# Patient Record
Sex: Male | Born: 1965 | Race: White | Hispanic: No | Marital: Married | Smoking: Former smoker
Health system: Southern US, Community
[De-identification: ages and names within clinical notes are randomized; demographics above are authoritative.]

## PROBLEM LIST (undated history)

## (undated) DIAGNOSIS — M1990 Unspecified osteoarthritis, unspecified site: Secondary | ICD-10-CM

## (undated) DIAGNOSIS — K219 Gastro-esophageal reflux disease without esophagitis: Secondary | ICD-10-CM

## (undated) DIAGNOSIS — G4730 Sleep apnea, unspecified: Secondary | ICD-10-CM

## (undated) DIAGNOSIS — J189 Pneumonia, unspecified organism: Secondary | ICD-10-CM

## (undated) DIAGNOSIS — J45909 Unspecified asthma, uncomplicated: Secondary | ICD-10-CM

## (undated) DIAGNOSIS — I10 Essential (primary) hypertension: Secondary | ICD-10-CM

## (undated) DIAGNOSIS — I499 Cardiac arrhythmia, unspecified: Secondary | ICD-10-CM

## (undated) HISTORY — PX: HERNIA REPAIR: SHX51

## (undated) HISTORY — PX: CARPAL TUNNEL RELEASE: SHX101

---

## 1998-06-28 ENCOUNTER — Ambulatory Visit (HOSPITAL_COMMUNITY): Admission: RE | Admit: 1998-06-28 | Discharge: 1998-06-28 | Payer: Self-pay | Admitting: Specialist

## 1998-06-29 ENCOUNTER — Encounter: Admission: RE | Admit: 1998-06-29 | Discharge: 1998-09-27 | Payer: Self-pay | Admitting: Specialist

## 1999-01-31 ENCOUNTER — Encounter: Payer: Self-pay | Admitting: Specialist

## 1999-01-31 ENCOUNTER — Ambulatory Visit (HOSPITAL_COMMUNITY): Admission: RE | Admit: 1999-01-31 | Discharge: 1999-01-31 | Payer: Self-pay | Admitting: Specialist

## 2004-11-06 ENCOUNTER — Encounter: Admission: RE | Admit: 2004-11-06 | Discharge: 2004-11-06 | Payer: Self-pay | Admitting: Family Medicine

## 2005-03-04 ENCOUNTER — Emergency Department (HOSPITAL_COMMUNITY): Admission: EM | Admit: 2005-03-04 | Discharge: 2005-03-05 | Payer: Self-pay | Admitting: Emergency Medicine

## 2006-08-06 ENCOUNTER — Emergency Department (HOSPITAL_COMMUNITY): Admission: EM | Admit: 2006-08-06 | Discharge: 2006-08-06 | Payer: Self-pay | Admitting: Emergency Medicine

## 2006-08-06 ENCOUNTER — Ambulatory Visit: Payer: Self-pay | Admitting: Cardiology

## 2006-08-24 ENCOUNTER — Ambulatory Visit: Payer: Self-pay | Admitting: Cardiovascular Disease

## 2007-02-28 ENCOUNTER — Emergency Department (HOSPITAL_COMMUNITY): Admission: EM | Admit: 2007-02-28 | Discharge: 2007-02-28 | Payer: Self-pay | Admitting: Emergency Medicine

## 2009-02-28 ENCOUNTER — Emergency Department (HOSPITAL_BASED_OUTPATIENT_CLINIC_OR_DEPARTMENT_OTHER): Admission: EM | Admit: 2009-02-28 | Discharge: 2009-02-28 | Payer: Self-pay | Admitting: Emergency Medicine

## 2009-02-28 ENCOUNTER — Ambulatory Visit: Payer: Self-pay | Admitting: Diagnostic Radiology

## 2010-06-23 ENCOUNTER — Emergency Department (HOSPITAL_COMMUNITY): Admission: EM | Admit: 2010-06-23 | Discharge: 2010-06-24 | Payer: Self-pay | Admitting: Emergency Medicine

## 2011-01-17 LAB — BASIC METABOLIC PANEL
CO2: 25 mEq/L (ref 19–32)
Calcium: 8.7 mg/dL (ref 8.4–10.5)
Creatinine, Ser: 0.96 mg/dL (ref 0.4–1.5)
GFR calc non Af Amer: >60 mL/min (ref >60)
Glucose, Bld: 96 mg/dL (ref 70–99)
Sodium: 134 mEq/L — ABNORMAL LOW (ref 135–145)

## 2011-01-17 LAB — CBC
MCH: 31.8 pg (ref 26.0–34.0)
MCV: 88.3 fL (ref 78.0–100.0)
Platelets: 233 10*3/uL (ref 150–400)
RBC: 4.69 MIL/uL (ref 4.22–5.81)
RDW: 12.6 % (ref 11.5–15.5)
WBC: 13.3 10*3/uL — ABNORMAL HIGH (ref 4.0–10.5)

## 2011-01-17 LAB — DIFFERENTIAL
Basophils Absolute: 0 10*3/uL (ref 0.0–0.1)
Basophils Relative: 0 % (ref 0–1)
Eosinophils Absolute: 0.3 10*3/uL (ref 0.0–0.7)
Eosinophils Relative: 2 % (ref 0–5)
Neutrophils Relative %: 74 % (ref 43–77)

## 2011-01-17 LAB — HEPATIC FUNCTION PANEL
Albumin: 3.7 g/dL (ref 3.5–5.2)
Total Protein: 7 g/dL (ref 6.0–8.3)

## 2011-01-17 LAB — URINALYSIS, ROUTINE W REFLEX MICROSCOPIC
Bilirubin Urine: NEGATIVE
Nitrite: NEGATIVE
Specific Gravity, Urine: 1.016 (ref 1.005–1.030)
Urobilinogen, UA: 0.2 mg/dL (ref 0.0–1.0)
pH: 8.5 — ABNORMAL HIGH (ref 5.0–8.0)

## 2011-01-17 LAB — LIPASE, BLOOD: Lipase: 21 U/L (ref 11–59)

## 2011-03-21 NOTE — H&P (Signed)
NAME:  Robert Bonilla, Robert Bonilla                ACCOUNT NO.:  [id number]   MEDICAL RECORD NO.:  [id number]          PATIENT TYPE:  EMS   LOCATION:  [facility]                         FACILITY:  [facility]   PHYSICIAN:  Gerrit Friends. Dietrich Pates, MD, FACCDATE OF BIRTH:  1966-10-11   DATE OF ADMISSION:  08/06/2006  DATE OF DISCHARGE:  08/06/2006                                HISTORY & PHYSICAL   EMERGENCY DEPARTMENT NOTE:   REFERRING PHYSICIAN:  Carleene Cooper, M.D.   PRIMARY CARE PHYSICIAN:  Nurse practitioner with Deboraha Sprang.   PRIMARY CARE PHYSICIAN:  Noralyn Pick. Eden Emms, MD, [facility]   HISTORY OF PRESENT ILLNESS:  A 45 year old gentleman with a recent history  of hypertension presenting to the emergency department with 4 hours of  palpitations.  Mr. Yaffe has had intermittent chest discomfort and underwent  a cardiac catheterization approximately 10 years ago with normal results.  He was recently found to have cardiomegaly on chest x-ray.  An  echocardiogram was reportedly unremarkable except for slight cardiac  enlargement.  He has never had any thyroid problems.  He does not take any  over-the-counter stimulants.  He does not use significant amounts of  caffeine.  He does not use significant amounts of alcohol nor illicit drugs.  He has had episodic palpitations in the past lasting less than 1 minute.  This morning, he noted the sudden onset of similar palpitations, but these  have been prolonged.  On the way to the emergency department, he noted some  burning in his right chest.  He has had no dyspnea.  He has had some  flushing.  Since diltiazem was administered with slowing of the rate, he has  felt fine.   PAST MEDICAL HISTORY:  1. Hypertension diagnosed 6 months ago and treated with a single drug.  2. He was seen in the emergency department 1 year ago for chest discomfort      and palpitations..  He was treated with potassium at that time.  3. He has mild asthma that has not required much in the way of  treatment.   PRIOR SURGERIES:  1. Bilateral carpal tunnel releases.  2. Repair of an umbilical hernia.  3. There is a history of GERD controlled with a PPI.   ALLERGIES:  The patient has no known allergies.   CURRENT MEDICATIONS:  1. Benicar.  2. Prilosec.  3. Advair discus.   SOCIAL HISTORY:  Remote use of tobacco products with total consumption of  approximately 10 pack-years.  No excessive use of alcohol.   FAMILY HISTORY:  No prominent coronary disease.   REVIEW OF SYSTEMS:  Noncontributory, with pertinent negatives as noted  above.   PHYSICAL EXAMINATION:  GENERAL:  A pleasant gentleman in no acute distress.  VITAL SIGNS:  The temperature is 97.9, heart rate 85 and irregular, blood  pressure 105/70, O2 saturation 98% on 2 liters of nasal O2.  HEENT:  Pupils equal, round, react to light; EOMs full; normal oral mucosa.  NECK:  No jugular venous distension; normal carotid upstrokes without  bruits.  ENDOCRINE:  No thyromegaly.  HEMATOPOIETIC:  No adenopathy.  SKIN:  No significant lesions.  LUNGS:  Clear.  CARDIAC:  Irregular rhythm; normal first and second heart sounds.  ABDOMEN:  Soft and nontender; normal bowel sounds; no masses; no  organomegaly.  EXTREMITIES:  Normal distal pulses; no edema.  NEUROMUSCULAR:  Symmetric strength and tone.  MUSCULOSKELETAL:  No joint deformities; surgical scars over the palms  bilaterally.   ELECTROCARDIOGRAM:  atrial fibrillation with rapid ventricular response;  early R-wave progression - possible lead placement error; nonspecific ST-T  wave abnormality.   Initial point of the care markers are negative.  Metabolic profile and CBC  are normal.   CHEST X-RAY:  Unremarkable.   IMPRESSION:  Mr. Ferencz presents with a new onset of atrial fibrillation  after likely brief episodes in the past.  Hypertension could be  contributing, or his arrhythmia may be idiopathic.  A TSH level will be  obtained to rule out hyperthyroidism.  Since  his arrhythmia has been present  for less than 24 hours, acute cardioversion can be attempted.  Flecainide  300 mg will be given.  He will be monitored in the emergency department for  a few hours.  If he converts, he will be sent home after initiating therapy  with a beta blocker, which will also probably control his mild hypertension.  Due to his mild right-sided chest burning, an additional set of point of  care markers will be obtained prior to discharge.      Gerrit Friends. Dietrich Pates, MD, [facility]  Electronically Signed     RMR/MEDQ  D:  08/06/2006  T:  08/07/2006  Job:  [id number]

## 2011-03-21 NOTE — Assessment & Plan Note (Signed)
[facility]                              CARDIOLOGY OFFICE NOTE   Robert, Bonilla                       MRN:          [id number]  DATE:08/24/2006                            DOB:          1966-09-09    Robert Bonilla is seen today in followup.  I believe I saw him back in 1999.  He  was more recently seen by Dr. Dietrich Pates for PAF.   The patient has a history of hypertension.   He is on Advair for some asthma.  Dr. Dietrich Pates saw him for some PAF.  He  converted in the emergency room.  He was sent home on Toprol rather than  Benicar.  He did not feel well on this at all.  He felt fatigued and had a  headache.  He stopped it on his own and switched back to his Benicar.  He  has continued to have occasional palpitations.  They are not very often and  they do not cause too much distress.   The patient has a history of normal cath in 1999.  There is no other history  of structural heart disease.   He ruled out for myocardial infarction when he was seen in the hospital.  His thyroid function was normal.   He has not had any previous embolic events.  He is not having significant  chest pain, PND or orthopnea.   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   EXAM:  VITAL SIGNS:  Blood pressure is reasonable at 130/72.  Pulse is 73  and regular.  LUNGS:  Clear.  NECK:  Carotids are normal.  There is no thyromegaly.  There is no  adenopathy.  SKIN:  Warm and dry.  HEART:  There is S1 and S2.  Normal heart sounds.  ABDOMEN:  Benign.  LOWER EXTREMITIES:  Pulses no edema.   MEDICATIONS:  1. Prilosec 20 a day.  2. Benicar 40/25.  3. Advair 100/50.  4. Ventolin.  5. Claritin.   IMPRESSION:  Stable palpitations, possible recurrent paroxysmal atrial  fibrillation.  The patient given prescription for 10 of Inderal to take on a  p.r.n. basis since he did not tolerate the  Toprol.  Will continue his  Benicar for blood pressure control.  He knows to call us if he has  significant recurrent episodes for an event monitor.  I will see him back in  3 months' to reassess his blood pressure and rhythm.   I will have to look through his chart to see if he has had a 2-D  echocardiogram recently to assess his LV function.   He needs to lose weight and we talked about his diet a little bit.   I suspect that not all of his palpitations represent paroxysmal atrial  fibrillation.  I do not think there is any indication for Coumadin at this  time.    ______________________________  Noralyn Pick Eden Emms, MD, [facility]    PCN/MedQ  DD: 08/24/2006  DT:  08/25/2006  Job #: [id number]

## 2011-12-28 ENCOUNTER — Encounter (HOSPITAL_COMMUNITY): Payer: Self-pay | Admitting: *Deleted

## 2011-12-28 ENCOUNTER — Emergency Department (HOSPITAL_COMMUNITY): Payer: BC Managed Care – PPO

## 2011-12-28 ENCOUNTER — Emergency Department (HOSPITAL_COMMUNITY)
Admission: EM | Admit: 2011-12-28 | Discharge: 2011-12-28 | Disposition: A | Discharge status: Home Health | Payer: BC Managed Care – PPO | Attending: Emergency Medicine | Admitting: Emergency Medicine

## 2011-12-28 DIAGNOSIS — M47812 Spondylosis without myelopathy or radiculopathy, cervical region: Secondary | ICD-10-CM | POA: Insufficient documentation

## 2011-12-28 DIAGNOSIS — I10 Essential (primary) hypertension: Secondary | ICD-10-CM | POA: Insufficient documentation

## 2011-12-28 DIAGNOSIS — J3489 Other specified disorders of nose and nasal sinuses: Secondary | ICD-10-CM | POA: Insufficient documentation

## 2011-12-28 DIAGNOSIS — R2990 Unspecified symptoms and signs involving the nervous system: Secondary | ICD-10-CM

## 2011-12-28 DIAGNOSIS — R209 Unspecified disturbances of skin sensation: Secondary | ICD-10-CM | POA: Insufficient documentation

## 2011-12-28 DIAGNOSIS — K219 Gastro-esophageal reflux disease without esophagitis: Secondary | ICD-10-CM | POA: Insufficient documentation

## 2011-12-28 DIAGNOSIS — R29898 Other symptoms and signs involving the musculoskeletal system: Secondary | ICD-10-CM | POA: Insufficient documentation

## 2011-12-28 HISTORY — DX: Gastro-esophageal reflux disease without esophagitis: K21.9

## 2011-12-28 HISTORY — DX: Essential (primary) hypertension: I10

## 2011-12-28 LAB — DIFFERENTIAL
Basophils Absolute: 0 10*3/uL (ref 0.0–0.1)
Basophils Relative: 0 % (ref 0–1)
Eosinophils Absolute: 0.2 10*3/uL (ref 0.0–0.7)
Monocytes Relative: 10 % (ref 3–12)
Neutrophils Relative %: 54 % (ref 43–77)

## 2011-12-28 LAB — CBC
Hemoglobin: 15.3 g/dL (ref 13.0–17.0)
MCH: 30.8 pg (ref 26.0–34.0)
MCHC: 36 g/dL (ref 30.0–36.0)
RDW: 12.8 % (ref 11.5–15.5)

## 2011-12-28 LAB — BASIC METABOLIC PANEL
BUN: 11 mg/dL (ref 6–23)
Creatinine, Ser: 0.91 mg/dL (ref 0.50–1.35)
GFR calc Af Amer: >90 mL/min (ref >90)
GFR calc non Af Amer: >90 mL/min (ref >90)
Potassium: 3.6 mEq/L (ref 3.5–5.1)

## 2011-12-28 LAB — GLUCOSE, CAPILLARY: Glucose-Capillary: 84 mg/dL (ref 70–99)

## 2011-12-28 LAB — POCT I-STAT TROPONIN I

## 2011-12-28 MED ORDER — SODIUM CHLORIDE 0.9 % IV BOLUS (SEPSIS)
500.0000 mL | Freq: Once | INTRAVENOUS | Status: AC
  Administered 2011-12-28: 500 mL via INTRAVENOUS

## 2011-12-28 NOTE — ED Notes (Signed)
Pt on stretcher, nad noted.

## 2011-12-28 NOTE — Discharge Instructions (Signed)
Mr Kimmel for CT of your head today shows no acute processes. The heaviness in your  left arm may be coming from cervical disc degeneration. Followup with your PCP next week and he also followup with an orthopedic doctor to have your cervical spine evaluated. Return to the ER for severe weakness, numbness or you are unable to hold items in your L hand.

## 2011-12-28 NOTE — ED Provider Notes (Signed)
History     CSN: [id number]  Arrival date & time 12/28/11  1505   First MD Initiated Contact with Patient 12/28/11 1526      Chief Complaint  Patient presents with  . Stroke Symptoms    (Consider location/radiation/quality/duration/timing/severity/associated sxs/prior treatment) Patient is a 46 y.o. male presenting with extremity weakness. The history is provided by the patient. No language interpreter was used.  Extremity Weakness This is a new problem. The current episode started today. Episode frequency: once. The problem has been resolved. Associated symptoms include congestion. Pertinent negatives include no chest pain, fever, headaches, neck pain, numbness or weakness.   Patient was throwing ball 2 hours ago with son when his LUE felt "heavy" and he had to stop throwing the ball.  He then had numbness and tingling in his L jaw for several minutes that has resolved presently.  States that he still has heaviness in his left upper extremity but that has improved as well. Prior elbow surgery on the left and prior carpal tunnel surgery on the left as well. He goes to [facility] his PCP and [facility] in the past.  Denies Weakness, speech problems or balance problems.  Will do a CT of the head EKG troponin and labs and then reevaluate. States he has had some UR symptoms this week. Pt is right handed. Neuro in tact presently. Reports remote cervical problems 15 years ago.  Father has had 2 tias and Grandfather has had a stroke.   Past Medical History  Diagnosis Date  . Hypertension   . Acid reflux     History reviewed. No pertinent past surgical history.  History reviewed. No pertinent family history.  History  Substance Use Topics  . Smoking status: Not on file  . Smokeless tobacco: Not on file  . Alcohol Use: Yes     occ      Review of Systems  Constitutional: Negative for fever.  HENT: Positive for congestion and rhinorrhea. Negative for facial  swelling, neck pain and neck stiffness.   Respiratory: Negative for chest tightness and shortness of breath.   Cardiovascular: Negative for chest pain, palpitations and leg swelling.  Gastrointestinal: Negative.   Musculoskeletal: Positive for extremity weakness. Negative for back pain and gait problem.  Skin: Negative.   Neurological: Negative for dizziness, tremors, seizures, syncope, facial asymmetry, speech difficulty, weakness, light-headedness, numbness and headaches.       Heaviness to LUE numbness and tingling  to L jaw  Psychiatric/Behavioral: Negative.  Negative for suicidal ideas, confusion and sleep disturbance. The patient is not nervous/anxious.   All other systems reviewed and are negative.    Allergies  Review of patient's allergies indicates no known allergies.  Home Medications   Current Outpatient Rx  Name Route Sig Dispense Refill  . LISINOPRIL 10 MG PO TABS Oral Take 10 mg by mouth daily.    . OMEGA-3-ACID ETHYL ESTERS 1 G PO CAPS Oral Take 2 g by mouth daily.    [name] Kitchen OMEPRAZOLE 20 MG PO CPDR Oral Take 20 mg by mouth daily.      BP 133/75  Pulse 79  Temp(Src) 97.6 F (36.4 C) (Oral)  Resp 18  SpO2 97%  Physical Exam  Nursing note and vitals reviewed. Constitutional: He is oriented to person, place, and time. He appears well-developed and well-nourished.  HENT:  Head: Normocephalic and atraumatic.  Eyes: Pupils are equal, round, and reactive to light.  Neck: Neck supple.  Cardiovascular: Normal rate, regular  rhythm, normal heart sounds and intact distal pulses.  Exam reveals no gallop and no friction rub.   No murmur heard. Pulmonary/Chest: Effort normal and breath sounds normal. No respiratory distress.  Abdominal: Soft. He exhibits no distension.  Musculoskeletal: Normal range of motion.  Neurological: He is alert and oriented to person, place, and time. He has normal strength. He displays normal reflexes. No cranial nerve deficit or sensory deficit.  Coordination normal. GCS eye subscore is 4. GCS verbal subscore is 5. GCS motor subscore is 6.  Skin: Skin is warm and dry.  Psychiatric: He has a normal mood and affect.    ED Course  Procedures (including critical care time)   Labs Reviewed  GLUCOSE, CAPILLARY  CBC  DIFFERENTIAL  BASIC METABOLIC PANEL   No results found.   No diagnosis found.    MDM  1530- 1540  patient not in the room.  46 year old hypertension hyperlipidemia coming in today after a feeling of heaviness in his left upper extremity and numbness and tingling in his left jaw. The tingling lasted several minutes and the heaviness in his left arm is better now but still feels somewhat heavy 2 to out of 10 where it was 8/10. CT of the head is normal. EKG and troponin are negative. Remote cervical problems in the past 15 years ago. Doubt this was a neurological event. Possible cervical degeneration as the cause. He will followup with his PCP next week. He'll also followup with his orthopedic as needed. Return for severe left upper extremity weakness or dropping things.   Date: 12/28/2011  Rate: 67  Rhythm: normal sinus rhythm  QRS Axis: normal  Intervals: normal  ST/T Wave abnormalities: normal  Conduction Disutrbances:first-degree A-V block   Narrative Interpretation: New 1st degree av block was in afib in 2007  Old EKG Reviewed: changes noted      Jethro Bastos, NP 12/29/11 0410

## 2011-12-28 NOTE — ED Notes (Signed)
Gave old and new ECG to Dr. Freida Busman after I performed. 5:13pm JG.

## 2011-12-28 NOTE — ED Provider Notes (Signed)
Medical screening examination/treatment/procedure(s) were conducted as a shared visit with non-physician practitioner(s) and myself.  I personally evaluated the patient during the encounter  Patient seen and examined. Patient states that his left arm heaviness which has been persistent all day starts in his neck and certain movements of his neck make this worse. History of cervical disc disease in the past as well 2. He also noted some left lower jaw facial tingling without numbness. Patients upper extremity heaviness is likely due to cervical disc disease. Will send cardiac enzymes head CT suspect this will be negative he'll followup with Dr.  Toy Baker, MD 12/28/11 [phone]

## 2011-12-28 NOTE — ED Notes (Signed)
Reports playing ball with his son at approx 1330, started having numbness sensation to left side of face and left arm felt heavy. Pt was assessed by dr Brooke Dare at triage, has no deficits, speech clear, no facial droop, equal arm strength. Pt reports that symptoms have not resolved at this time.

## 2011-12-28 NOTE — ED Notes (Signed)
Lab at bedside

## 2011-12-28 NOTE — ED Notes (Signed)
Pt has not arrived in room

## 2011-12-29 NOTE — ED Provider Notes (Signed)
Medical screening examination/treatment/procedure(s) were conducted as a shared visit with non-physician practitioner(s) and myself.  I personally evaluated the patient during the encounter  Toy Baker, MD 12/29/11 2228

## 2014-06-20 ENCOUNTER — Other Ambulatory Visit (HOSPITAL_COMMUNITY): Payer: Self-pay | Admitting: Specialist

## 2014-06-20 ENCOUNTER — Ambulatory Visit (HOSPITAL_COMMUNITY)
Admission: RE | Admit: 2014-06-20 | Discharge: 2014-06-20 | Disposition: A | Discharge status: Home / Self Care | Payer: Worker's Compensation | Source: Ambulatory Visit | Attending: Specialist | Admitting: Specialist

## 2014-06-20 DIAGNOSIS — Z9189 Other specified personal risk factors, not elsewhere classified: Secondary | ICD-10-CM | POA: Diagnosis present

## 2014-06-20 DIAGNOSIS — M25512 Pain in left shoulder: Secondary | ICD-10-CM

## 2014-06-20 DIAGNOSIS — Z0389 Encounter for observation for other suspected diseases and conditions ruled out: Secondary | ICD-10-CM | POA: Diagnosis not present

## 2014-06-20 DIAGNOSIS — Z1389 Encounter for screening for other disorder: Secondary | ICD-10-CM | POA: Diagnosis present

## 2014-06-20 DIAGNOSIS — M25519 Pain in unspecified shoulder: Secondary | ICD-10-CM | POA: Diagnosis present

## 2014-07-31 ENCOUNTER — Encounter (HOSPITAL_COMMUNITY): Payer: Self-pay

## 2014-08-02 NOTE — Pre-Procedure Instructions (Signed)
Kathlene NovemberLowell D Bettcher  08/02/2014   Your procedure is scheduled on:  Thurs, Oct 8 @ 12:30 PM  [facility]  at 10:30 AM.  Call this number if you have problems the morning of surgery: [phone]   Remember:   Do not eat food or drink liquids after midnight.   Take these medicines the morning of surgery with A SIP OF WATER: Omeprazole(Prilosec)               Stop taking your Ibuprofen. No Goody's,BC's,Aleve,Aspirin,Fish Oil,or any Herbal Medications   Do not wear jewelry  Do not wear lotions, powders, or colognes.   Men may shave face and neck.  Do not bring valuables to the hospital.  [facility] is not responsible                  for any belongings or valuables.               Contacts, dentures or bridgework may not be worn into surgery.  Leave suitcase in the car. After surgery it may be brought to your room.  For patients admitted to the hospital, discharge time is determined by your                treatment team.               Patients discharged the day of surgery will not be allowed to drive  home.    Special Instructions:  [place] - Preparing for Surgery  Before surgery, you can play an important role.  Because skin is not sterile, your skin needs to be as free of germs as possible.  You can reduce the number of germs on you skin by washing with CHG (chlorahexidine gluconate) soap before surgery.  CHG is an antiseptic cleaner which kills germs and bonds with the skin to continue killing germs even after washing.  Please DO NOT use if you have an allergy to CHG or antibacterial soaps.  If your skin becomes reddened/irritated stop using the CHG and inform your nurse when you arrive at Short Stay.  Do not shave (including legs and underarms) for at least 48 hours prior to the first CHG shower.  You may shave your face.  Please follow these instructions carefully:   1.  Shower with CHG Soap the night before surgery and the                                 morning of Surgery.  2.  If you choose to wash your hair, wash your hair first as usual with your       normal shampoo.  3.  After you shampoo, rinse your hair and body thoroughly to remove the                      Shampoo.  4.  Use CHG as you would any other liquid soap.  You can apply chg directly       to the skin and wash gently with scrungie or a clean washcloth.  5.  Apply the CHG Soap to your body ONLY FROM THE NECK DOWN.        Do not use on open wounds or open sores.  Avoid contact with your eyes,       ears, mouth and genitals (private parts).  Wash genitals (private parts)  with your normal soap.  6.  Wash thoroughly, paying special attention to the area where your surgery        will be performed.  7.  Thoroughly rinse your body with warm water from the neck down.  8.  DO NOT shower/wash with your normal soap after using and rinsing off       the CHG Soap.  9.  Pat yourself dry with a clean towel.            10.  Wear clean pajamas.            11.  Place clean sheets on your bed the night of your first shower and do not        sleep with pets.  Day of Surgery  Do not apply any lotions/deoderants the morning of surgery.  Please wear clean clothes to the hospital/surgery center.     Please read over the following fact sheets that you were given: Pain Booklet, Coughing and Deep Breathing and Surgical Site Infection Prevention

## 2014-08-03 ENCOUNTER — Encounter (HOSPITAL_COMMUNITY)
Admission: RE | Admit: 2014-08-03 | Discharge: 2014-08-03 | Disposition: A | Discharge status: Home / Self Care | Payer: Worker's Compensation | Source: Ambulatory Visit | Attending: Orthopedic Surgery | Admitting: Orthopedic Surgery

## 2014-08-03 ENCOUNTER — Encounter (HOSPITAL_COMMUNITY): Payer: Self-pay

## 2014-08-03 ENCOUNTER — Ambulatory Visit (HOSPITAL_COMMUNITY)
Admission: RE | Admit: 2014-08-03 | Discharge: 2014-08-03 | Disposition: A | Discharge status: Home / Self Care | Payer: Worker's Compensation | Source: Ambulatory Visit | Attending: Anesthesiology | Admitting: Anesthesiology

## 2014-08-03 DIAGNOSIS — I10 Essential (primary) hypertension: Secondary | ICD-10-CM | POA: Diagnosis not present

## 2014-08-03 DIAGNOSIS — M19012 Primary osteoarthritis, left shoulder: Secondary | ICD-10-CM | POA: Diagnosis not present

## 2014-08-03 DIAGNOSIS — Z01810 Encounter for preprocedural cardiovascular examination: Secondary | ICD-10-CM | POA: Diagnosis not present

## 2014-08-03 DIAGNOSIS — S43432A Superior glenoid labrum lesion of left shoulder, initial encounter: Secondary | ICD-10-CM | POA: Diagnosis not present

## 2014-08-03 DIAGNOSIS — X58XXXA Exposure to other specified factors, initial encounter: Secondary | ICD-10-CM | POA: Diagnosis not present

## 2014-08-03 DIAGNOSIS — Z01812 Encounter for preprocedural laboratory examination: Secondary | ICD-10-CM | POA: Diagnosis present

## 2014-08-03 DIAGNOSIS — M7542 Impingement syndrome of left shoulder: Secondary | ICD-10-CM | POA: Insufficient documentation

## 2014-08-03 HISTORY — DX: Cardiac arrhythmia, unspecified: I49.9

## 2014-08-03 HISTORY — DX: Sleep apnea, unspecified: G47.30

## 2014-08-03 HISTORY — DX: Pneumonia, unspecified organism: J18.9

## 2014-08-03 HISTORY — DX: Unspecified osteoarthritis, unspecified site: M19.90

## 2014-08-03 HISTORY — DX: Unspecified asthma, uncomplicated: J45.909

## 2014-08-03 LAB — CBC WITH DIFFERENTIAL/PLATELET
BASOS PCT: 1 % (ref 0–1)
Basophils Absolute: 0 10*3/uL (ref 0.0–0.1)
EOS ABS: 0.2 10*3/uL (ref 0.0–0.7)
Eosinophils Relative: 3 % (ref 0–5)
HEMATOCRIT: 42 % (ref 39.0–52.0)
Hemoglobin: 13.8 g/dL (ref 13.0–17.0)
LYMPHS ABS: 1.5 10*3/uL (ref 0.7–4.0)
Lymphocytes Relative: 24 % (ref 12–46)
MCH: 27.9 pg (ref 26.0–34.0)
MCHC: 32.9 g/dL (ref 30.0–36.0)
MCV: 84.8 fL (ref 78.0–100.0)
Monocytes Absolute: 0.5 10*3/uL (ref 0.1–1.0)
Monocytes Relative: 7 % (ref 3–12)
NEUTROS ABS: 4 10*3/uL (ref 1.7–7.7)
NEUTROS PCT: 65 % (ref 43–77)
Platelets: 254 10*3/uL (ref 150–400)
RBC: 4.95 MIL/uL (ref 4.22–5.81)
RDW: 14 % (ref 11.5–15.5)
WBC: 6.2 10*3/uL (ref 4.0–10.5)

## 2014-08-03 LAB — COMPREHENSIVE METABOLIC PANEL
ALK PHOS: 62 U/L (ref 39–117)
ALT: 41 U/L (ref 0–53)
AST: 22 U/L (ref 0–37)
Albumin: 3.9 g/dL (ref 3.5–5.2)
Anion gap: 14 (ref 5–15)
BILIRUBIN TOTAL: 0.7 mg/dL (ref 0.3–1.2)
BUN: 12 mg/dL (ref 6–23)
CHLORIDE: 101 meq/L (ref 96–112)
CO2: 23 mEq/L (ref 19–32)
Calcium: 9.3 mg/dL (ref 8.4–10.5)
Creatinine, Ser: 0.89 mg/dL (ref 0.50–1.35)
GLUCOSE: 107 mg/dL — AB (ref 70–99)
POTASSIUM: 4.2 meq/L (ref 3.7–5.3)
Sodium: 138 mEq/L (ref 137–147)
Total Protein: 7.9 g/dL (ref 6.0–8.3)

## 2014-08-03 LAB — APTT: aPTT: 26 seconds (ref 24–37)

## 2014-08-03 LAB — PROTIME-INR
INR: 0.98 (ref 0.00–1.49)
Prothrombin Time: 13 seconds (ref 11.6–15.2)

## 2014-08-04 NOTE — Progress Notes (Addendum)
Anesthesia Chart Review:  Pt is 48 year old male scheduled for L shoulder arthroscopy with labral debridement vs bicep tenodesis, subacromial decompression, distal clavicle resection on 08/10/14 with Dr. Rennis ChrisSupple.   PMH: paroxysmal atrial fibrillation, HTN, OSA, asthma, GERD  Medications include: lisinopril, prilosec  Preoperative labs reviewed.    Chest x-ray reviewed. No active cardiopulmonary process.  EKG: Sinus rhythm with Premature supraventricular complexes Nonspecific T wave abnormality.   Last seen by cardiology by Dr. Eden EmmsNishan on 08/24/2006. Stable at that time. By notes, pt had hx normal cath in 1999.   Discussed with Dr. Randa EvensEdwards who requests medical clearance.   Spoke with Toniann FailWendy in Dr. Dub MikesSupple's office who will arrange for medical clearance with pt's PCP, Lovenia KimMark Hepler, PA-C at [facility] [place].   Rica Mastngela Kabbe, FNP-BC [facility]/Anesthesiology Phone: [phone] 08/04/2014 12:37 PM  Addendum: Patient was seen for medical clearance by Johna SheriffMark Helper, PA-C on 08/07/14.  He cleared patient for upcoming surgery.   Velna Ochsllison Taylen Wendland, PA-C [facility]iology Phone [phone] 08/08/2014 9:24 AM

## 2014-08-07 NOTE — Progress Notes (Signed)
Spoke with Darla at [facility] office.  Pt to be seen today for medical clearance.  Office to send over info after seeing the Dr today.( Medical clearance, Last office visit, and any cardiac studies, and sleep study)

## 2014-08-09 MED ORDER — CEFAZOLIN SODIUM-DEXTROSE 2-3 GM-% IV SOLR
2.0000 g | INTRAVENOUS | Status: AC
  Administered 2014-08-10: 2 g via INTRAVENOUS

## 2014-08-10 ENCOUNTER — Encounter (HOSPITAL_COMMUNITY): Payer: Self-pay | Admitting: *Deleted

## 2014-08-10 ENCOUNTER — Ambulatory Visit (HOSPITAL_COMMUNITY): Payer: Worker's Compensation | Admitting: Certified Registered Nurse Anesthetist

## 2014-08-10 ENCOUNTER — Ambulatory Visit (HOSPITAL_COMMUNITY)
Admission: RE | Admit: 2014-08-10 | Discharge: 2014-08-10 | Disposition: A | Discharge status: Home / Self Care | Payer: Worker's Compensation | Source: Ambulatory Visit | Attending: Orthopedic Surgery | Admitting: Orthopedic Surgery

## 2014-08-10 ENCOUNTER — Encounter (HOSPITAL_COMMUNITY)
Admission: RE | Disposition: A | Discharge status: Home / Self Care | Payer: Self-pay | Source: Ambulatory Visit | Attending: Orthopedic Surgery

## 2014-08-10 ENCOUNTER — Encounter (HOSPITAL_COMMUNITY): Payer: Worker's Compensation | Admitting: Emergency Medicine

## 2014-08-10 DIAGNOSIS — Y9289 Other specified places as the place of occurrence of the external cause: Secondary | ICD-10-CM | POA: Insufficient documentation

## 2014-08-10 DIAGNOSIS — Z87891 Personal history of nicotine dependence: Secondary | ICD-10-CM | POA: Insufficient documentation

## 2014-08-10 DIAGNOSIS — G4730 Sleep apnea, unspecified: Secondary | ICD-10-CM | POA: Insufficient documentation

## 2014-08-10 DIAGNOSIS — K219 Gastro-esophageal reflux disease without esophagitis: Secondary | ICD-10-CM | POA: Insufficient documentation

## 2014-08-10 DIAGNOSIS — W19XXXA Unspecified fall, initial encounter: Secondary | ICD-10-CM | POA: Insufficient documentation

## 2014-08-10 DIAGNOSIS — I10 Essential (primary) hypertension: Secondary | ICD-10-CM | POA: Diagnosis not present

## 2014-08-10 DIAGNOSIS — Y998 Other external cause status: Secondary | ICD-10-CM | POA: Diagnosis not present

## 2014-08-10 DIAGNOSIS — I480 Paroxysmal atrial fibrillation: Secondary | ICD-10-CM | POA: Diagnosis not present

## 2014-08-10 DIAGNOSIS — M12812 Other specific arthropathies, not elsewhere classified, left shoulder: Secondary | ICD-10-CM | POA: Diagnosis not present

## 2014-08-10 DIAGNOSIS — M7542 Impingement syndrome of left shoulder: Secondary | ICD-10-CM | POA: Insufficient documentation

## 2014-08-10 DIAGNOSIS — S43432A Superior glenoid labrum lesion of left shoulder, initial encounter: Secondary | ICD-10-CM | POA: Insufficient documentation

## 2014-08-10 DIAGNOSIS — Y9389 Activity, other specified: Secondary | ICD-10-CM | POA: Diagnosis not present

## 2014-08-10 DIAGNOSIS — J45909 Unspecified asthma, uncomplicated: Secondary | ICD-10-CM | POA: Diagnosis not present

## 2014-08-10 HISTORY — PX: SHOULDER ARTHROSCOPY WITH SUBACROMIAL DECOMPRESSION AND BICEP TENDON REPAIR: SHX5689

## 2014-08-10 SURGERY — SHOULDER ARTHROSCOPY WITH SUBACROMIAL DECOMPRESSION AND BICEP TENDON REPAIR
Anesthesia: Regional | Site: Shoulder | Laterality: Left

## 2014-08-10 MED ORDER — HYDROMORPHONE HCL 1 MG/ML IJ SOLN
0.2500 mg | INTRAMUSCULAR | Status: DC | PRN
  Administered 2014-08-10: 0.5 mg via INTRAVENOUS

## 2014-08-10 MED ORDER — FENTANYL CITRATE 0.05 MG/ML IJ SOLN
INTRAMUSCULAR | Status: DC | PRN
  Administered 2014-08-10: 50 ug via INTRAVENOUS
  Administered 2014-08-10: 100 ug via INTRAVENOUS

## 2014-08-10 MED ORDER — CEFAZOLIN SODIUM-DEXTROSE 2-3 GM-% IV SOLR
INTRAVENOUS | Status: AC
  Filled 2014-08-10: qty 50

## 2014-08-10 MED ORDER — LIDOCAINE HCL (CARDIAC) 20 MG/ML IV SOLN
INTRAVENOUS | Status: AC
  Filled 2014-08-10: qty 5

## 2014-08-10 MED ORDER — ARTIFICIAL TEARS OP OINT
TOPICAL_OINTMENT | OPHTHALMIC | Status: DC | PRN
  Administered 2014-08-10: 1 via OPHTHALMIC

## 2014-08-10 MED ORDER — MIDAZOLAM HCL 5 MG/5ML IJ SOLN
INTRAMUSCULAR | Status: DC | PRN
  Administered 2014-08-10: 2 mg via INTRAVENOUS

## 2014-08-10 MED ORDER — FENTANYL CITRATE 0.05 MG/ML IJ SOLN
INTRAMUSCULAR | Status: AC
  Filled 2014-08-10: qty 5

## 2014-08-10 MED ORDER — MIDAZOLAM HCL 2 MG/2ML IJ SOLN
2.0000 mg | Freq: Once | INTRAMUSCULAR | Status: AC
  Administered 2014-08-10: 2 mg via INTRAVENOUS

## 2014-08-10 MED ORDER — OXYCODONE HCL 5 MG/5ML PO SOLN: 5.0000 mg | Freq: Once | ORAL | Status: DC | PRN

## 2014-08-10 MED ORDER — CHLORHEXIDINE GLUCONATE 4 % EX LIQD
60.0000 mL | Freq: Once | CUTANEOUS | Status: DC
  Filled 2014-08-10: qty 60

## 2014-08-10 MED ORDER — DIAZEPAM 5 MG PO TABS: 2.5000 mg | ORAL_TABLET | Freq: Four times a day (QID) | ORAL | Status: AC | PRN

## 2014-08-10 MED ORDER — FENTANYL CITRATE 0.05 MG/ML IJ SOLN
100.0000 ug | Freq: Once | INTRAMUSCULAR | Status: AC
  Administered 2014-08-10: 100 ug via INTRAVENOUS

## 2014-08-10 MED ORDER — ONDANSETRON HCL 4 MG/2ML IJ SOLN
INTRAMUSCULAR | Status: DC | PRN
  Administered 2014-08-10: 4 mg via INTRAVENOUS

## 2014-08-10 MED ORDER — LACTATED RINGERS IV SOLN
INTRAVENOUS | Status: DC
  Administered 2014-08-10: 12:00:00 via INTRAVENOUS

## 2014-08-10 MED ORDER — ONDANSETRON HCL 4 MG/2ML IJ SOLN
INTRAMUSCULAR | Status: AC
  Filled 2014-08-10: qty 2

## 2014-08-10 MED ORDER — LACTATED RINGERS IV SOLN
INTRAVENOUS | Status: DC | PRN
  Administered 2014-08-10 (×2): via INTRAVENOUS

## 2014-08-10 MED ORDER — PROPOFOL 10 MG/ML IV BOLUS
INTRAVENOUS | Status: DC | PRN
  Administered 2014-08-10: 200 mg via INTRAVENOUS

## 2014-08-10 MED ORDER — LIDOCAINE HCL (CARDIAC) 20 MG/ML IV SOLN
INTRAVENOUS | Status: DC | PRN
  Administered 2014-08-10: 80 mg via INTRAVENOUS

## 2014-08-10 MED ORDER — FENTANYL CITRATE 0.05 MG/ML IJ SOLN
INTRAMUSCULAR | Status: AC
  Administered 2014-08-10: 100 ug via INTRAVENOUS
  Filled 2014-08-10: qty 2

## 2014-08-10 MED ORDER — MIDAZOLAM HCL 2 MG/2ML IJ SOLN
INTRAMUSCULAR | Status: AC
  Administered 2014-08-10: 2 mg via INTRAVENOUS
  Filled 2014-08-10: qty 2

## 2014-08-10 MED ORDER — OXYCODONE HCL 5 MG PO TABS: 5.0000 mg | ORAL_TABLET | Freq: Once | ORAL | Status: DC | PRN

## 2014-08-10 MED ORDER — HYDROMORPHONE HCL 1 MG/ML IJ SOLN
INTRAMUSCULAR | Status: AC
  Administered 2014-08-10: 0.5 mg via INTRAVENOUS
  Filled 2014-08-10: qty 1

## 2014-08-10 MED ORDER — LACTATED RINGERS IV SOLN: INTRAVENOUS | Status: DC

## 2014-08-10 MED ORDER — OXYCODONE-ACETAMINOPHEN 5-325 MG PO TABS
1.0000 | ORAL_TABLET | ORAL | Status: DC | PRN
Start: 2014-08-10 — End: 2015-04-26

## 2014-08-10 MED ORDER — MIDAZOLAM HCL 2 MG/2ML IJ SOLN
INTRAMUSCULAR | Status: AC
  Filled 2014-08-10: qty 2

## 2014-08-10 MED ORDER — ONDANSETRON HCL 4 MG/2ML IJ SOLN: 4.0000 mg | Freq: Four times a day (QID) | INTRAMUSCULAR | Status: DC | PRN

## 2014-08-10 MED ORDER — DEXAMETHASONE SODIUM PHOSPHATE 4 MG/ML IJ SOLN
INTRAMUSCULAR | Status: DC | PRN
  Administered 2014-08-10: 4 mg via INTRAVENOUS

## 2014-08-10 MED ORDER — SUCCINYLCHOLINE CHLORIDE 20 MG/ML IJ SOLN
INTRAMUSCULAR | Status: DC | PRN
  Administered 2014-08-10: 100 mg via INTRAVENOUS

## 2014-08-10 MED ORDER — SODIUM CHLORIDE 0.9 % IR SOLN
Status: DC | PRN
  Administered 2014-08-10: 6000 mL

## 2014-08-10 MED ORDER — NAPROXEN 500 MG PO TABS: 500.0000 mg | ORAL_TABLET | Freq: Two times a day (BID) | ORAL | Status: AC

## 2014-08-10 SURGICAL SUPPLY — 65 items
BLADE CUTTER GATOR 3.5 (BLADE) ×3 IMPLANT
BLADE GREAT WHITE 4.2 (BLADE) ×2 IMPLANT
BLADE GREAT WHITE 4.2MM (BLADE) ×1
BLADE SURG 11 STRL SS (BLADE) ×3 IMPLANT
BOOTCOVER CLEANROOM LRG (PROTECTIVE WEAR) ×6 IMPLANT
BUR 3.5 LG SPHERICAL (BURR) IMPLANT
BUR OVAL 4.0 (BURR) ×3 IMPLANT
BURR 3.5 LG SPHERICAL (BURR)
BURR 3.5MM LG SPHERICAL (BURR)
CANISTER SUCT LVC 12 LTR MEDI- (MISCELLANEOUS) ×3 IMPLANT
CANNULA ACUFLEX KIT 5X76 (CANNULA) ×3 IMPLANT
CANNULA DRILOCK 5.0MMX75MM (CANNULA)
CANNULA DRILOCK 5.0X75 (CANNULA) IMPLANT
CLOSURE STERI-STRIP 1/2X4 (GAUZE/BANDAGES/DRESSINGS) ×1
CLOSURE WOUND 1/2 X4 (GAUZE/BANDAGES/DRESSINGS) ×1
CLSR STERI-STRIP ANTIMIC 1/2X4 (GAUZE/BANDAGES/DRESSINGS) ×2 IMPLANT
CONNECTOR 5 IN 1 STRAIGHT STRL (MISCELLANEOUS) ×3 IMPLANT
DRAPE INCISE 23X17 IOBAN STRL (DRAPES) ×2
DRAPE INCISE IOBAN 23X17 STRL (DRAPES) ×1 IMPLANT
DRAPE INCISE IOBAN 66X45 STRL (DRAPES) ×3 IMPLANT
DRAPE STERI 35X30 U-POUCH (DRAPES) ×3 IMPLANT
DRAPE SURG 17X11 SM STRL (DRAPES) ×3 IMPLANT
DRAPE U-SHAPE 47X51 STRL (DRAPES) ×3 IMPLANT
DRSG PAD ABDOMINAL 8X10 ST (GAUZE/BANDAGES/DRESSINGS) ×6 IMPLANT
DURAPREP 26ML APPLICATOR (WOUND CARE) ×6 IMPLANT
ELECT REM PT RETURN 9FT ADLT (ELECTROSURGICAL) ×3
ELECTRODE REM PT RTRN 9FT ADLT (ELECTROSURGICAL) ×1 IMPLANT
GAUZE SPONGE 4X4 12PLY STRL (GAUZE/BANDAGES/DRESSINGS) ×3 IMPLANT
GLOVE BIO SURGEON STRL SZ7.5 (GLOVE) ×3 IMPLANT
GLOVE BIO SURGEON STRL SZ8 (GLOVE) ×3 IMPLANT
GLOVE EUDERMIC 7 POWDERFREE (GLOVE) ×3 IMPLANT
GLOVE SS BIOGEL STRL SZ 7.5 (GLOVE) ×1 IMPLANT
GLOVE SUPERSENSE BIOGEL SZ 7.5 (GLOVE) ×2
GOWN STRL REUS W/ TWL LRG LVL3 (GOWN DISPOSABLE) ×1 IMPLANT
GOWN STRL REUS W/ TWL XL LVL3 (GOWN DISPOSABLE) ×4 IMPLANT
GOWN STRL REUS W/TWL LRG LVL3 (GOWN DISPOSABLE) ×2
GOWN STRL REUS W/TWL XL LVL3 (GOWN DISPOSABLE) ×8
KIT BASIN OR (CUSTOM PROCEDURE TRAY) ×3 IMPLANT
KIT BIO-TENODESIS 3X8 DISP (MISCELLANEOUS)
KIT INSRT BABSR STRL DISP BTN (MISCELLANEOUS) IMPLANT
KIT ROOM TURNOVER OR (KITS) ×3 IMPLANT
KIT SHOULDER TRACTION (DRAPES) ×3 IMPLANT
MANIFOLD NEPTUNE II (INSTRUMENTS) ×3 IMPLANT
NDL SUT 6 .5 CRC .975X.05 MAYO (NEEDLE) IMPLANT
NEEDLE MAYO TAPER (NEEDLE)
NEEDLE SPNL 18GX3.5 QUINCKE PK (NEEDLE) ×3 IMPLANT
NS IRRIG 1000ML POUR BTL (IV SOLUTION) ×3 IMPLANT
PACK SHOULDER (CUSTOM PROCEDURE TRAY) ×3 IMPLANT
PAD ARMBOARD 7.5X6 YLW CONV (MISCELLANEOUS) ×6 IMPLANT
SET ARTHROSCOPY TUBING (MISCELLANEOUS) ×2
SET ARTHROSCOPY TUBING LN (MISCELLANEOUS) ×1 IMPLANT
SLING ARM LRG ADULT FOAM STRAP (SOFTGOODS) IMPLANT
SLING ARM MED ADULT FOAM STRAP (SOFTGOODS) ×3 IMPLANT
SPONGE GAUZE 4X4 12PLY STER LF (GAUZE/BANDAGES/DRESSINGS) ×3 IMPLANT
SPONGE LAP 4X18 X RAY DECT (DISPOSABLE) ×3 IMPLANT
STRIP CLOSURE SKIN 1/2X4 (GAUZE/BANDAGES/DRESSINGS) ×2 IMPLANT
SUT MNCRL AB 3-0 PS2 18 (SUTURE) ×3 IMPLANT
SUT PDS AB 0 CT 36 (SUTURE) IMPLANT
SUT RETRIEVER GRASP 30 DEG (SUTURE) IMPLANT
SYR 20CC LL (SYRINGE) ×3 IMPLANT
TAPE PAPER 3X10 WHT MICROPORE (GAUZE/BANDAGES/DRESSINGS) ×3 IMPLANT
TOWEL OR 17X24 6PK STRL BLUE (TOWEL DISPOSABLE) ×3 IMPLANT
TOWEL OR 17X26 10 PK STRL BLUE (TOWEL DISPOSABLE) ×3 IMPLANT
WAND SUCTION MAX 4MM 90S (SURGICAL WAND) ×3 IMPLANT
WATER STERILE IRR 1000ML POUR (IV SOLUTION) ×3 IMPLANT

## 2014-08-10 NOTE — H&P (Signed)
Robert Bonilla Robert Bonilla Robert Bonilla Bonilla Robert Bonilla Robert Bonilla Bonilla    Chief Complaint: left shoulder impingement HPI: The patient is a 48 y.o. male with persistent left shoulder pain after a fall with impingement, AC joint OA, and possible labral tear  Past Medical History  Diagnosis Date  . Acid reflux   . Hypertension     on meds  . Dysrhythmia     Hx of PAF, stated stress related  . Asthma   . Pneumonia     years ago  . Arthritis   . Sleep apnea     uses CPAP, [place]    Past Surgical History  Procedure Laterality Date  . Carpal tunnel release Bilateral   . Hernia repair      umbilical    History reviewed. No pertinent family history.  Social History:  reports that he quit smoking about 17 years ago. His smoking use included Cigarettes. He has a 15 pack-year smoking history. He has never used smokeless tobacco. He reports that he drinks alcohol. He reports that he does not use illicit drugs.  Allergies: No Known Allergies  Medications Prior to Admission  Medication Sig Dispense Refill  . ibuprofen (ADVIL,MOTRIN) 200 MG tablet Take 200 mg by mouth every 6 (six) hours as needed for mild pain.      [name] Kitchen. lisinopril (PRINIVIL,ZESTRIL) 10 MG tablet Take 10 mg by mouth daily.      [name] Kitchen. omeprazole (PRILOSEC) 20 MG capsule Take 20 mg by mouth daily.         Physical Exam: left shoulder with painful and restricted motion as noted at recent office visits  Vitals  Temp:  [98.7 F (37.1 C)] 98.7 F (37.1 C) (10/08 1043) Pulse Rate:  [69] 69 (10/08 1043) Resp:  [18] 18 (10/08 1043) BP: (139)/(66) 139/66 mmHg (10/08 1043) SpO2:  [95 %] 95 % (10/08 1043) Weight:  [109.952 kg (242 lb 6.4 oz)] 109.952 kg (242 lb 6.4 oz) (10/08 1043)  Assessment/Plan  Impression: left shoulder impingement  Plan of Action: Procedure(s): LEFT SHOULDER ARTHROSCOPY WITH LABRAL DEBRIDEMENT VS BICEPS TENDODESIS/SUBACROMIAL DECOMPRESSION AND DISTAL CLAVICLE RESECTION  Robert Bonilla Robert Bonilla Bonilla Robert Bonilla 08/10/2014, 11:14 AM

## 2014-08-10 NOTE — Anesthesia Preprocedure Evaluation (Addendum)
Anesthesia Evaluation  Patient identified by MRN, date of birth, ID band Patient awake    Reviewed: Allergy & Precautions, H&P , NPO status , Patient's Chart, lab work & pertinent test results  Airway Mallampati: II  Neck ROM: full    Dental   Pulmonary asthma , sleep apnea and Continuous Positive Airway Pressure Ventilation , former smoker,          Cardiovascular hypertension, + dysrhythmias Atrial Fibrillation     Neuro/Psych    GI/Hepatic GERD-  ,  Endo/Other  obese  Renal/GU      Musculoskeletal  (+) Arthritis -,   Abdominal   Peds  Hematology   Anesthesia Other Findings   Reproductive/Obstetrics                          Anesthesia Physical Anesthesia Plan  ASA: II  Anesthesia Plan: General and Regional   Post-op Pain Management: MAC Combined w/ Regional for Post-op pain   Induction: Intravenous  Airway Management Planned: Oral ETT  Additional Equipment:   Intra-op Plan:   Post-operative Plan: Extubation in OR  Informed Consent: I have reviewed the patients History and Physical, chart, labs and discussed the procedure including the risks, benefits and alternatives for the proposed anesthesia with the patient or authorized representative who has indicated his/her understanding and acceptance.     Plan Discussed with: CRNA, Anesthesiologist and Surgeon  Anesthesia Plan Comments:         Anesthesia Quick Evaluation

## 2014-08-10 NOTE — Op Note (Signed)
08/10/2014  1:22 PM  PATIENT:   Robert Bonilla  48 y.o. male  PRE-OPERATIVE DIAGNOSIS:  left shoulder impingement, AC joint OA  POST-OPERATIVE DIAGNOSIS:  Same with type 1 SLAP lesion  PROCEDURE:  LSA, labral debridement, SAD, DCR  SURGEON:  Luciel Brickman, Vania ReaKevin M. M.D.  ASSISTANTS: Shuford pac   ANESTHESIA:   GET + ISB  EBL: min  SPECIMEN:  none  Drains: none   PATIENT DISPOSITION:  PACU - hemodynamically stable.    PLAN OF CARE: Discharge to home after PACU  Dictation# [id number]

## 2014-08-10 NOTE — Transfer of Care (Signed)
Immediate Anesthesia Transfer of Care Note  Patient: Robert Bonilla  Procedure(s) Performed: Procedure(s): LEFT SHOULDER ARTHROSCOPY WITH LABRAL DEBRIDEMENT  (Left)  Patient Location: PACU  Anesthesia Type:GA combined with regional for post-op pain  Level of Consciousness: awake, alert  and oriented  Airway & Oxygen Therapy: Patient Spontanous Breathing and Patient connected to face mask oxygen  Post-op Assessment: Report given to PACU RN and Post -op Vital signs reviewed and stable  Post vital signs: Reviewed and stable  Complications: No apparent anesthesia complications

## 2014-08-10 NOTE — Anesthesia Postprocedure Evaluation (Signed)
  Anesthesia Post-op Note  Patient: Robert Bonilla  Procedure(s) Performed: Procedure(s): LEFT SHOULDER ARTHROSCOPY WITH LABRAL DEBRIDEMENT  (Left)  Patient Location: PACU  Anesthesia Type:General  Level of Consciousness: awake and alert   Airway and Oxygen Therapy: Patient Spontanous Breathing  Post-op Pain: mild  Post-op Assessment: Post-op Vital signs reviewed  Post-op Vital Signs: Reviewed and stable  Last Vitals:  Filed Vitals:   08/10/14 1150  BP: 129/57  Pulse: 97  Temp:   Resp: 20    Complications: No apparent anesthesia complications

## 2014-08-10 NOTE — Anesthesia Procedure Notes (Addendum)
Procedure Name: Intubation Date/Time: 08/10/2014 12:10 PM Performed by: Reine JustFLOWERS, ROKOSHI T Pre-anesthesia Checklist: Patient identified, Emergency Drugs available, Suction available, Patient being monitored and Timeout performed Patient Re-evaluated:Patient Re-evaluated prior to inductionOxygen Delivery Method: Circle system utilized and Simple face mask Preoxygenation: Pre-oxygenation with 100% oxygen Intubation Type: IV induction Ventilation: Mask ventilation with difficulty, Two handed mask ventilation required and Oral airway inserted - appropriate to patient size Laryngoscope Size: Mac and 4 Grade View: Grade II Tube type: Oral Tube size: 7.5 mm Number of attempts: 1 Airway Equipment and Method: Patient positioned with wedge pillow and Stylet Placement Confirmation: ETT inserted through vocal cords under direct vision,  positive ETCO2 and breath sounds checked- equal and bilateral Secured at: 23 cm Tube secured with: Tape Dental Injury: Teeth and Oropharynx as per pre-operative assessment    Anesthesia Regional Block:  Interscalene brachial plexus block  Pre-Anesthetic Checklist: ,, timeout performed, Correct Patient, Correct Site, Correct Laterality, Correct Procedure, Correct Position, site marked, Risks and benefits discussed,  Surgical consent,  Pre-op evaluation,  At surgeon's request and post-op pain management  Laterality: Left  Prep: chloraprep       Needles:  Injection technique: Single-shot  Needle Type: Echogenic Stimulator Needle     Needle Length: 5cm 5 cm Needle Gauge: 22 and 22 G    Additional Needles:  Procedures: ultrasound guided (picture in chart) and nerve stimulator Interscalene brachial plexus block  Nerve Stimulator or Paresthesia:  Response: biceps flexion, 0.45 mA,   Additional Responses:   Narrative:  Start time: 08/10/2014 11:35 AM End time: 08/10/2014 11:45 AM Injection made incrementally with aspirations every 5 mL.  Performed by:  Personally  Anesthesiologist: Dr Chaney MallingHodierne  Additional Notes: Functioning IV was confirmed and monitors were applied.  A 50mm 22ga Arrow echogenic stimulator needle was used. Sterile prep and drape,hand hygiene and sterile gloves were used.  Negative aspiration and negative test dose prior to incremental administration of local anesthetic. The patient tolerated the procedure well.  Ultrasound guidance: relevent anatomy identified, needle position confirmed, local anesthetic spread visualized around nerve(s), vascular puncture avoided.  Image printed for medical record.

## 2014-08-10 NOTE — Discharge Instructions (Signed)
° °Kevin M. Supple, M.D., F.A.A.O.S. °Orthopaedic Surgery °Specializing in Arthroscopic and Reconstructive °Surgery of the Shoulder and Knee °[phone] °[address] - [place], [place] [address] - Fax [phone] ° ° °POST-OP SHOULDER ARTHROSCOPY INSTRUCTIONS ° °1. Call the office at [phone] to schedule your first post-op appointment 7-10 days from the date of your surgery. ° °2. Leave the steri-strips in place over your incisions when performing dressing changes and showering. You may remove your dressings and begin showering 72 hours from surgery. You can expect drainage that is clear to bloody in nature that occasionally will soak through your dressings. If this occurs go ahead and perform a dressing change. The drainage should lessen daily and when there is no drainage from your incisions feel free to go without a dressing. ° °3. Wear your sling for comfort. You may come out of your sling for ad lib activity and even decide not to use the sling at all. If you find you are more comfortable in your sling, make sure you come out of your sling at least 3-4 times a day to do the exercises that are included below. ° °4. Range of motion to your elbow, wrist, and hand are encouraged 3-5 times daily. Exercise to your hand and fingers helps to reduce swelling you may experience. ° °5. Utilize ice to the shoulder 3-4 times minimum a day and additionally if you are experiencing pain. ° °6. You may drive when safely off narcotics and muscle relaxants. ° °7. If you had a block pre-operatively to provide post-op pain relief you may want to go ahead and begin utilizing your pain meds as your arm begins to wake up. Blocks can sometimes last up to 16-18 hours. If you are still pain-free prior to going to bed you may want to strongly consider taking a pain medication to avoid being awakened in the night with the onset of pain. A muscle relaxant is also provided for you should you experience muscle spasms. It  is recommended that if you are experiencing pain that your pain medication alone is not controlling, add the muscle relaxant along with the pain medication which can give additional pain relief. The first one to two days is generally the most severe of your pain and then should gradually decrease. As your pain lessens it is recommended that you decrease your use of the pain medications to an "as needed basis" only and to always comply with the recommended dosages of the pain medications. ° °8. Pain medications can produce constipation along with their use. If you experience this, the use of an over the counter stool softener or laxative daily is recommended.  ° °9. For additional questions or concerns, please do not hesitate to call the office. If after hours there is an answering service to forward your concerns to the physician on call. ° ° °POST-OP EXERCISES ° °The pendulum exercises should be performed while bending at the waist as far over as possible thereby letting gravity do the work for you. ° °Range of Motion Exercises: Pendulum (circular) ° °Repeat 20 times. Do 3 sessions per day. ° ° ° ° °Range of Motion Exercises: Pendulum (side-to-side) ° °Repeat 20 times. Do 3 sessions per day. ° ° ° °Range of Motion Exercises (self-stretching activities): ° °Slide arm up wall with palm toward you, moving closer to the wall. Hold for 5 seconds. ° °Repeat 10 times. Do 3 sessions per day. ° ° ° ° ° °What to eat: ° °For your   first meals, you should eat lightly; only small meals initially.  If you do not have nausea, you may eat larger meals.  Avoid spicy, greasy and heavy food.   ° °General Anesthesia, Adult, Care After  °Refer to this sheet in the next few weeks. These instructions provide you with information on caring for yourself after your procedure. Your health care provider may also give you more specific instructions. Your treatment has been planned according to current medical practices, but problems sometimes  occur. Call your health care provider if you have any problems or questions after your procedure.  °WHAT TO EXPECT AFTER THE PROCEDURE  °After the procedure, it is typical to experience:  °Sleepiness.  °Nausea and vomiting. °HOME CARE INSTRUCTIONS  °For the first 24 hours after general anesthesia:  °Have a responsible person with you.  °Do not drive a car. If you are alone, do not take public transportation.  °Do not drink alcohol.  °Do not take medicine that has not been prescribed by your health care provider.  °Do not sign important papers or make important decisions.  °You may resume a normal diet and activities as directed by your health care provider.  °Change bandages (dressings) as directed.  °If you have questions or problems that seem related to general anesthesia, call the hospital and ask for the anesthetist or anesthesiologist on call. °SEEK MEDICAL CARE IF:  °You have nausea and vomiting that continue the day after anesthesia.  °You develop a rash. °SEEK IMMEDIATE MEDICAL CARE IF:  °You have difficulty breathing.  °You have chest pain.  °You have any allergic problems. °Document Released: 01/26/2001 Document Revised: 06/22/2013 Document Reviewed: 05/05/2013  °[employer]® Patient Information ©2014 [employer].  ° ° °

## 2014-08-11 ENCOUNTER — Encounter (HOSPITAL_COMMUNITY): Payer: Self-pay | Admitting: Orthopedic Surgery

## 2014-08-11 NOTE — Op Note (Signed)
NAMBarrett Henle:  Cutrone, Samy                ACCOUNT NO.:  [id number]  MEDICAL RECORD NO.:  [id number]  LOCATION:  [facility]                         FACILITY:  [facility]  PHYSICIAN:  Vania ReaKevin M. Ira Busbin, M.D.  DATE OF BIRTH:  [birth year]  DATE OF PROCEDURE:  08/10/2014 DATE OF DISCHARGE:  08/10/2014                              OPERATIVE REPORT   PREOPERATIVE DIAGNOSIS: 1. Chronic left shoulder impingement syndrome. 2. Left shoulder AC joint arthropathy.  POSTOPERATIVE DIAGNOSIS: 1. Chronic left shoulder impingement syndrome. 2. Left shoulder AC joint arthropathy. 3. Left shoulder type I superior labral tear.  PROCEDURE: 1. Left shoulder examination under anesthesia. 2. Left shoulder acromion joint diagnostic arthroscopy. 3. Debridement of anterior and superior labral tears. 4. Arthroscopic subacromial decompression and bursectomy. 5. Arthroscopic distal clavicle resection.  SURGEON:  Vania ReaKevin M. Harlie Buening, M.D.  Threasa HeadsASSISTANFrench Ana:  Tracy A. Shuford, PA-C  ANESTHESIA:  General endotracheal as well as an interscalene block.  ESTIMATED BLOOD LOSS:  Minimal.  DRAINS:  None.  HISTORY:  Mr. Grace IsaacWatts is a [age] year old male who sustained a work-related left shoulder injury a number of months ago.  Unfortunately, had continued difficulty with left shoulder pain, weakness, functional limitations, and symptoms that have been refractory to prolonged attempts at conservative management.  His preoperative x-rays and MRI scan shows some evidence of AC joint degenerative changes as well as a possible superior labral tear, but no obvious discrete rotator cuff tear.  Due to his ongoing pain and functional limitations and failure to respond to conservative management, he is brought to the operating room at this time for planned left shoulder arthroscopy as described below.  Preoperatively, I counseled Mr. Rehfeld on treatment options as well as risks versus benefits thereof.  Possible surgical complications were all reviewed  including potential for bleeding, infection, neurovascular injury, persistent pain, loss of motion, anesthetic complication, possible need for additional surgery.  He understands and accepts and agrees with our planned procedure.  PROCEDURE IN DETAIL:  After undergoing routine preop evaluation, the patient received prophylactic antibiotics.  An interscalene block was established in the holding area by the Anesthesia Department.  Placed supine on the operating table, underwent smooth induction of a general endotracheal anesthesia.  Turned to the right lateral decubitus position on a beanbag and appropriately padded and protected.  The left shoulder examination under anesthesia revealed essentially full motion with 170 degrees for elevation, 80 degrees of internal and external and internal rotation.  Left arm was then suspended at 70 degrees abduction with 15 pounds of traction, and the left shoulder girdle region was sterilely prepped and draped in standard fashion.  Time-out was called.  Posterior portal was established in the glenohumeral joint.  Portal was established under direct visualization.  The articular surfaces were all found to be in good condition.  No instability patterns were noted.  It was found to be an anterior labral tear which had somewhat acute appearance with a small bone fragment related to the anterior glenoid perhaps no more than 2 mm to 2.5 mm and while this appeared to be traumatic labral tearing, there certainly no evidence for anterior instability and the tear was debrided with a shaver.  There was also  a type I superior labral tear and this was debrided with a shaver and the majority of the biceps anchor was stable and intact and the biceps tendon itself showed normal caliber with no evidence for distal instability.  Once we debrided the anterior and superior labral tears, the balance of the glenoid joint showed no additional pathologies.  The rotator cuff  was carefully inspected and found to be intact and pristine throughout.  Trigger surfaces again were all found to be in excellent condition with no instability noted.  At this point, fluid and instruments were then removed.  The arm was dropped down to 30 degrees of abduction with the arthroscope introduced in the subacromial space to the posterior portal and a direct lateral portal established in the subacromial space.  Abundant dense bursal tissue and multiple adhesions were encountered, and these all divided and excised with a combination of shaver and the Stryker wand.  The wand was then used to remove the periosteum from the undersurface of the anterior half of the acromion, and then subacromial decompression was performed with a bur creating a type 1 morphology.  Portal was then established directly anterior to the distal clavicle and the distal clavicle resection was performed with a bur.  Care was taken to confirm visualization of entire circumference of the distal clavicle to ensure adequate removal of bone.  We then completed subacromial/subdeltoid bursectomy.  The bursal surface rotator cuff was carefully inspected and probed, found to be intact.  At this point, final hemostasis was obtained.  Fluid and instruments were removed.  Portals were closed with Monocryl.  Steri-Strips and dry dressing taped to the left shoulder, left arm was placed in a sling. The patient was awakened, extubated, and taken to recovery room in stable condition.  Ralene Batheracy Shuford, PA-C, was used as an Geophysicist/field seismologistassistant throughout this case essentially for help with positioning the patient, positioning of extremity, management of arthroscopic equipment, tissue manipulation, wound closure, and intraoperative decision making.     Vania ReaKevin M. Naiyana Barbian, M.D.     KMS/MEDQ  D:  08/10/2014  T:  08/11/2014  Job:  [id number]

## 2014-08-22 NOTE — Addendum Note (Signed)
Addendum created 08/22/14 [id number] by Achille RichAdam Juandedios Dudash, MD   Modules edited: Anesthesia Blocks and Procedures, Clinical Notes   Clinical Notes:  File: [id number]

## 2015-04-26 ENCOUNTER — Emergency Department (HOSPITAL_COMMUNITY): Payer: BLUE CROSS/BLUE SHIELD

## 2015-04-26 ENCOUNTER — Encounter (HOSPITAL_COMMUNITY): Payer: Self-pay | Admitting: Emergency Medicine

## 2015-04-26 ENCOUNTER — Emergency Department (HOSPITAL_COMMUNITY)
Admission: EM | Admit: 2015-04-26 | Discharge: 2015-04-26 | Disposition: A | Discharge status: Home / Self Care | Payer: BLUE CROSS/BLUE SHIELD | Attending: Emergency Medicine | Admitting: Emergency Medicine

## 2015-04-26 DIAGNOSIS — J45909 Unspecified asthma, uncomplicated: Secondary | ICD-10-CM | POA: Diagnosis not present

## 2015-04-26 DIAGNOSIS — Z79899 Other long term (current) drug therapy: Secondary | ICD-10-CM | POA: Insufficient documentation

## 2015-04-26 DIAGNOSIS — G4730 Sleep apnea, unspecified: Secondary | ICD-10-CM | POA: Insufficient documentation

## 2015-04-26 DIAGNOSIS — M1990 Unspecified osteoarthritis, unspecified site: Secondary | ICD-10-CM | POA: Insufficient documentation

## 2015-04-26 DIAGNOSIS — K219 Gastro-esophageal reflux disease without esophagitis: Secondary | ICD-10-CM | POA: Diagnosis not present

## 2015-04-26 DIAGNOSIS — Z9981 Dependence on supplemental oxygen: Secondary | ICD-10-CM | POA: Insufficient documentation

## 2015-04-26 DIAGNOSIS — Z8701 Personal history of pneumonia (recurrent): Secondary | ICD-10-CM | POA: Diagnosis not present

## 2015-04-26 DIAGNOSIS — Z87891 Personal history of nicotine dependence: Secondary | ICD-10-CM | POA: Insufficient documentation

## 2015-04-26 DIAGNOSIS — K5732 Diverticulitis of large intestine without perforation or abscess without bleeding: Secondary | ICD-10-CM | POA: Diagnosis not present

## 2015-04-26 DIAGNOSIS — I10 Essential (primary) hypertension: Secondary | ICD-10-CM | POA: Diagnosis not present

## 2015-04-26 DIAGNOSIS — R1032 Left lower quadrant pain: Secondary | ICD-10-CM

## 2015-04-26 DIAGNOSIS — R109 Unspecified abdominal pain: Secondary | ICD-10-CM | POA: Diagnosis present

## 2015-04-26 DIAGNOSIS — M545 Low back pain: Secondary | ICD-10-CM | POA: Insufficient documentation

## 2015-04-26 LAB — CBC WITH DIFFERENTIAL/PLATELET
Basophils Absolute: 0 10*3/uL (ref 0.0–0.1)
Basophils Relative: 0 % (ref 0–1)
Eosinophils Absolute: 0.1 10*3/uL (ref 0.0–0.7)
Eosinophils Relative: 1 % (ref 0–5)
HEMATOCRIT: 44.2 % (ref 39.0–52.0)
HEMOGLOBIN: 14.8 g/dL (ref 13.0–17.0)
LYMPHS ABS: 1 10*3/uL (ref 0.7–4.0)
LYMPHS PCT: 8 % — AB (ref 12–46)
MCH: 28.8 pg (ref 26.0–34.0)
MCHC: 33.5 g/dL (ref 30.0–36.0)
MCV: 86.2 fL (ref 78.0–100.0)
Monocytes Absolute: 0.7 10*3/uL (ref 0.1–1.0)
Monocytes Relative: 6 % (ref 3–12)
NEUTROS ABS: 10.3 10*3/uL — AB (ref 1.7–7.7)
Neutrophils Relative %: 85 % — ABNORMAL HIGH (ref 43–77)
Platelets: 273 10*3/uL (ref 150–400)
RBC: 5.13 MIL/uL (ref 4.22–5.81)
RDW: 13.5 % (ref 11.5–15.5)
WBC: 12.2 10*3/uL — AB (ref 4.0–10.5)

## 2015-04-26 LAB — LIPASE, BLOOD: Lipase: 19 U/L — ABNORMAL LOW (ref 22–51)

## 2015-04-26 LAB — COMPREHENSIVE METABOLIC PANEL
ALT: 42 U/L (ref 17–63)
AST: 27 U/L (ref 15–41)
Albumin: 4.3 g/dL (ref 3.5–5.0)
Alkaline Phosphatase: 66 U/L (ref 38–126)
Anion gap: 10 (ref 5–15)
BUN: 11 mg/dL (ref 6–20)
CALCIUM: 9.1 mg/dL (ref 8.9–10.3)
CO2: 26 mmol/L (ref 22–32)
Chloride: 101 mmol/L (ref 101–111)
Creatinine, Ser: 0.98 mg/dL (ref 0.61–1.24)
GLUCOSE: 143 mg/dL — AB (ref 65–99)
Potassium: 3.8 mmol/L (ref 3.5–5.1)
Sodium: 137 mmol/L (ref 135–145)
Total Bilirubin: 0.8 mg/dL (ref 0.3–1.2)
Total Protein: 7.9 g/dL (ref 6.5–8.1)

## 2015-04-26 LAB — URINALYSIS, ROUTINE W REFLEX MICROSCOPIC
BILIRUBIN URINE: NEGATIVE
Glucose, UA: 500 mg/dL — AB
Ketones, ur: NEGATIVE mg/dL
Leukocytes, UA: NEGATIVE
Nitrite: NEGATIVE
Protein, ur: NEGATIVE mg/dL
Specific Gravity, Urine: 1.025 (ref 1.005–1.030)
Urobilinogen, UA: 1 mg/dL (ref 0.0–1.0)
pH: 6 (ref 5.0–8.0)

## 2015-04-26 LAB — URINE MICROSCOPIC-ADD ON

## 2015-04-26 MED ORDER — METRONIDAZOLE IN NACL 5-0.79 MG/ML-% IV SOLN
500.0000 mg | Freq: Once | INTRAVENOUS | Status: AC
  Administered 2015-04-26: 500 mg via INTRAVENOUS
  Filled 2015-04-26: qty 100

## 2015-04-26 MED ORDER — CIPROFLOXACIN HCL 500 MG PO TABS: 500.0000 mg | ORAL_TABLET | Freq: Two times a day (BID) | ORAL | Status: AC

## 2015-04-26 MED ORDER — IOHEXOL 300 MG/ML  SOLN
100.0000 mL | Freq: Once | INTRAMUSCULAR | Status: AC | PRN
  Administered 2015-04-26: 100 mL via INTRAVENOUS

## 2015-04-26 MED ORDER — METRONIDAZOLE 500 MG PO TABS: 500.0000 mg | ORAL_TABLET | Freq: Three times a day (TID) | ORAL | Status: AC

## 2015-04-26 MED ORDER — CIPROFLOXACIN IN D5W 400 MG/200ML IV SOLN
400.0000 mg | Freq: Once | INTRAVENOUS | Status: AC
  Administered 2015-04-26: 400 mg via INTRAVENOUS
  Filled 2015-04-26: qty 200

## 2015-04-26 MED ORDER — ONDANSETRON 8 MG PO TBDP: ORAL_TABLET | ORAL | Status: AC

## 2015-04-26 MED ORDER — OXYCODONE-ACETAMINOPHEN 5-325 MG PO TABS
1.0000 | ORAL_TABLET | Freq: Four times a day (QID) | ORAL | Status: DC | PRN
Start: 2015-04-26 — End: 2015-04-26

## 2015-04-26 MED ORDER — MORPHINE SULFATE 4 MG/ML IJ SOLN: 4.0000 mg | Freq: Once | INTRAMUSCULAR | Status: DC

## 2015-04-26 MED ORDER — HYDROMORPHONE HCL 1 MG/ML IJ SOLN
1.0000 mg | Freq: Once | INTRAMUSCULAR | Status: AC
  Administered 2015-04-26: 1 mg via INTRAVENOUS
  Filled 2015-04-26: qty 1

## 2015-04-26 MED ORDER — SODIUM CHLORIDE 0.9 % IV BOLUS (SEPSIS)
1000.0000 mL | Freq: Once | INTRAVENOUS | Status: AC
Start: 2015-04-26 — End: 2015-04-26
  Administered 2015-04-26: 1000 mL via INTRAVENOUS

## 2015-04-26 MED ORDER — OXYCODONE-ACETAMINOPHEN 5-325 MG PO TABS
1.0000 | ORAL_TABLET | Freq: Four times a day (QID) | ORAL | Status: AC | PRN
Start: 2015-04-26 — End: ?

## 2015-04-26 MED ORDER — ONDANSETRON HCL 4 MG/2ML IJ SOLN
4.0000 mg | Freq: Once | INTRAMUSCULAR | Status: AC
  Administered 2015-04-26: 4 mg via INTRAVENOUS
  Filled 2015-04-26: qty 2

## 2015-04-26 NOTE — Discharge Instructions (Signed)
1. Medications: zofran, percocet, cipro, flagyl, usual home medications 2. Treatment: rest, drink plenty of fluids, advance diet slowly 3. Follow Up: Please followup with your primary doctor on Monday days for discussion of your diagnoses and further evaluation after today's visit; if you do not have a primary care doctor use the resource guide provided to find one; Please return to the ER for persistent vomiting, high fevers or worsening symptoms    Diverticulitis Diverticulitis is inflammation or infection of small pouches in your colon that form when you have a condition called diverticulosis. The pouches in your colon are called diverticula. Your colon, or large intestine, is where water is absorbed and stool is formed. Complications of diverticulitis can include:  Bleeding.  Severe infection.  Severe pain.  Perforation of your colon.  Obstruction of your colon. CAUSES  Diverticulitis is caused by bacteria. Diverticulitis happens when stool becomes trapped in diverticula. This allows bacteria to grow in the diverticula, which can lead to inflammation and infection. RISK FACTORS People with diverticulosis are at risk for diverticulitis. Eating a diet that does not include enough fiber from fruits and vegetables may make diverticulitis more likely to develop. SYMPTOMS  Symptoms of diverticulitis may include:  Abdominal pain and tenderness. The pain is normally located on the left side of the abdomen, but may occur in other areas.  Fever and chills.  Bloating.  Cramping.  Nausea.  Vomiting.  Constipation.  Diarrhea.  Blood in your stool. DIAGNOSIS  Your health care provider will ask you about your medical history and do a physical exam. You may need to have tests done because many medical conditions can cause the same symptoms as diverticulitis. Tests may include:  Blood tests.  Urine tests.  Imaging tests of the abdomen, including X-rays and CT scans. When your  condition is under control, your health care provider may recommend that you have a colonoscopy. A colonoscopy can show how severe your diverticula are and whether something else is causing your symptoms. TREATMENT  Most cases of diverticulitis are mild and can be treated at home. Treatment may include:  Taking over-the-counter pain medicines.  Following a clear liquid diet.  Taking antibiotic medicines by mouth for 7-10 days. More severe cases may be treated at a hospital. Treatment may include:  Not eating or drinking.  Taking prescription pain medicine.  Receiving antibiotic medicines through an IV tube.  Receiving fluids and nutrition through an IV tube.  Surgery. HOME CARE INSTRUCTIONS   Follow your health care provider's instructions carefully.  Follow a full liquid diet or other diet as directed by your health care provider. After your symptoms improve, your health care provider may tell you to change your diet. He or she may recommend you eat a high-fiber diet. Fruits and vegetables are good sources of fiber. Fiber makes it easier to pass stool.  Take fiber supplements or probiotics as directed by your health care provider.  Only take medicines as directed by your health care provider.  Keep all your follow-up appointments. SEEK MEDICAL CARE IF:   Your pain does not improve.  You have a hard time eating food.  Your bowel movements do not return to normal. SEEK IMMEDIATE MEDICAL CARE IF:   Your pain becomes worse.  Your symptoms do not get better.  Your symptoms suddenly get worse.  You have a fever.  You have repeated vomiting.  You have bloody or black, tarry stools. MAKE SURE YOU:   Understand these instructions.  Will watch  your condition.  Will get help right away if you are not doing well or get worse. Document Released: 07/30/2005 Document Revised: 10/25/2013 Document Reviewed: 09/14/2013 [facility] Patient Information 2015 [place], [place]. This  information is not intended to replace advice given to you by your health care provider. Make sure you discuss any questions you have with your health care provider.

## 2015-04-26 NOTE — ED Provider Notes (Signed)
CSN: [id number]     Arrival date & time 04/26/15  1331 History   First MD Initiated Contact with Patient 04/26/15 1612     Chief Complaint  Patient presents with  . Abdominal Pain     (Consider location/radiation/quality/duration/timing/severity/associated sxs/prior Treatment) Patient is a 49 y.o. male presenting with abdominal pain. The history is provided by the patient and medical records. No language interpreter was used.  Abdominal Pain Associated symptoms: nausea   Associated symptoms: no chest pain, no constipation, no cough, no diarrhea, no dysuria, no fatigue, no fever, no hematuria, no shortness of breath and no vomiting      Robert Bonilla is a 49 y.o. male  with a hx of diverticulitis (4 years ago), HTN, asthma, GERD, PAF (stress induced with last episode 10 years ago) presents to the Emergency Department complaining of gradual, persistent, progressively worsening LLQ abd pain, sharp in nature onset 12:00 and significantly worsened to a 10/10 after defecation.  Associated symptoms include diaphoresis, nausea.  Nothing makes it better.  Pt reports that pain is somewhat duller, rated at a 6/10 and radiates into his left low back.  Pt denies fever, headache, neck pain, chest pain, SOB, vomiting, diarrhea, weakness, dizziness, syncope, dysuria, hematuria, melena, hematochezia.       Past Medical History  Diagnosis Date  . Acid reflux   . Hypertension     on meds  . Dysrhythmia     Hx of PAF, stated stress related  . Asthma   . Pneumonia     years ago  . Arthritis   . Sleep apnea     uses CPAP, sleep study done in [place], [place]   Past Surgical History  Procedure Laterality Date  . Carpal tunnel release Bilateral   . Hernia repair      umbilical  . Shoulder arthroscopy with subacromial decompression and bicep tendon repair Left 08/10/2014    Procedure: LEFT SHOULDER ARTHROSCOPY WITH LABRAL DEBRIDEMENT ;  Surgeon: Senaida Lange, MD;  Location: [facility] OR;  Service: Orthopedics;   Laterality: Left;   No family history on file. History  Substance Use Topics  . Smoking status: Former Smoker -- 1.50 packs/day for 10 years    Types: Cigarettes    Quit date: 08/03/1997  . Smokeless tobacco: Never Used  . Alcohol Use: Yes     Comment: occ    Review of Systems  Constitutional: Negative for fever, diaphoresis, appetite change, fatigue and unexpected weight change.  HENT: Negative for mouth sores and trouble swallowing.   Respiratory: Negative for cough, chest tightness, shortness of breath, wheezing and stridor.   Cardiovascular: Negative for chest pain and palpitations.  Gastrointestinal: Positive for nausea and abdominal pain ( LLQ). Negative for vomiting, diarrhea, constipation, blood in stool, abdominal distention and rectal pain.  Genitourinary: Negative for dysuria, urgency, frequency, hematuria, flank pain and difficulty urinating.  Musculoskeletal: Positive for back pain (left low back). Negative for neck pain and neck stiffness.  Skin: Negative for rash.  Neurological: Negative for weakness.  Hematological: Negative for adenopathy.  Psychiatric/Behavioral: Negative for confusion.  All other systems reviewed and are negative.     Allergies  Review of patient's allergies indicates no known allergies.  Home Medications   Prior to Admission medications   Medication Sig Start Date End Date Taking? Authorizing Provider  lisinopril (PRINIVIL,ZESTRIL) 10 MG tablet Take 10 mg by mouth daily.   Yes Historical Provider, MD  omeprazole (PRILOSEC) 20 MG capsule Take 20 mg by mouth  daily.   Yes Historical Provider, MD  ciprofloxacin (CIPRO) 500 MG tablet Take 1 tablet (500 mg total) by mouth 2 (two) times daily. One po bid x 10 days 04/26/15   Dahlia Client Aloysuis Ribaudo, PA-C  diazepam (VALIUM) 5 MG tablet Take 0.5-1 tablets (2.5-5 mg total) by mouth every 6 (six) hours as needed for muscle spasms or sedation. Patient not taking: Reported on 04/26/2015 08/10/14   Ralene Bathe, PA-C  metroNIDAZOLE (FLAGYL) 500 MG tablet Take 1 tablet (500 mg total) by mouth 3 (three) times daily. 04/26/15   Laritza Vokes, PA-C  naproxen (NAPROSYN) 500 MG tablet Take 1 tablet (500 mg total) by mouth 2 (two) times daily with a meal. Patient not taking: Reported on 04/26/2015 08/10/14   Ralene Bathe, PA-C  ondansetron (ZOFRAN ODT) 8 MG disintegrating tablet  ODT q4 hours prn nausea 04/26/15   Dahlia Client Deo Mehringer, PA-C  oxyCODONE-acetaminophen (PERCOCET/ROXICET) 5-325 MG per tablet Take 1-2 tablets by mouth every 6 (six) hours as needed for severe pain. 04/26/15   Adenike Shidler, PA-C   BP 123/83 mmHg  Pulse 106  Temp(Src) 98.5 F (36.9 C) (Oral)  Resp 18  Ht  (1.727 m)  Wt 235 lb (106.595 kg)  BMI 35.74 kg/m2  SpO2 96% Physical Exam  Constitutional: He appears well-developed and well-nourished.  HENT:  Head: Normocephalic and atraumatic.  Mouth/Throat: Oropharynx is clear and moist.  Eyes: Conjunctivae are normal. No scleral icterus.  Cardiovascular: Normal rate, regular rhythm, normal heart sounds and intact distal pulses.   No murmur heard. Pulmonary/Chest: Effort normal and breath sounds normal.  Abdominal: Soft. Bowel sounds are normal. He exhibits no distension and no mass. There is tenderness in the left lower quadrant. There is guarding. There is no rebound and no CVA tenderness.  General abd tenderness, significantly increased in the LLQ. No rebound or CVA tenderness    Neurological: He is alert.  Skin: Skin is warm and dry.  Psychiatric: He has a normal mood and affect.  Nursing note and vitals reviewed.   ED Course  Procedures (including critical care time) Labs Review Labs Reviewed  CBC WITH DIFFERENTIAL/PLATELET - Abnormal; Notable for the following:    WBC 12.2 (*)    Neutrophils Relative % 85 (*)    Neutro Abs 10.3 (*)    Lymphocytes Relative 8 (*)    All other components within normal limits  COMPREHENSIVE METABOLIC PANEL -  Abnormal; Notable for the following:    Glucose, Bld 143 (*)    All other components within normal limits  LIPASE, BLOOD - Abnormal; Notable for the following:    Lipase 19 (*)    All other components within normal limits  URINALYSIS, ROUTINE W REFLEX MICROSCOPIC (NOT AT [facility]) - Abnormal; Notable for the following:    Glucose, UA 500 (*)    Hgb urine dipstick SMALL (*)    All other components within normal limits  URINE MICROSCOPIC-ADD ON    Imaging Review Ct Abdomen Pelvis W Contrast  04/26/2015   CLINICAL DATA:  49 year old male with left lower quadrant abdominal pain and history of diverticulitis.  EXAM: CT ABDOMEN AND PELVIS WITH CONTRAST  TECHNIQUE: Multidetector CT imaging of the abdomen and pelvis was performed using the standard protocol following bolus administration of intravenous contrast.  CONTRAST:  OMNIPAQUE IOHEXOL 300 MG/ML  SOLN  COMPARISON:  CT dated 06/24/2010  FINDINGS: Lower chest:  Unremarkable.  Peritoneum: No free air or free fluid.  Liver: Unremarkable. Subcentimeter right hepatic hypodense focus  is too small to characterize.No intrahepatic biliary ductal dilatation.  Gallbladder: Unremarkable.  Pancreas: Unremarkable.No ductal dilatation.  Spleen: Unremarkable.  Adrenals glands: Unremarkable.  Kidneys, ureters, urinary bladder: Unremarkable.  Reproductive: Unremarkable.  Bowel and appendix: Scattered sigmoid and colonic diverticula. There is inflammatory changes with perisigmoid stranding compatible with diverticulitis. No abscess. No bowel obstruction.The appendix is unremarkable.  Vascular/Lymphatic: Unremarkable.No lymphadenopathy.  Musculoskeletal/Abdominal wall: Small bilateral fat containing inguinal hernias. Small benign-appearing move stable T10 vertebra lucent lesion.  IMPRESSION: Sigmoid diverticulitis.  No abscess.   Electronically Signed   By: Elgie Collard M.D.   On: 04/26/2015 18:52     EKG Interpretation None      MDM   Final diagnoses:  LLQ  abdominal pain  Diverticulitis of large intestine without perforation or abscess without bleeding   Robert Bonilla presents with left lower quadrant abdominal pain similar to previous episodes of diverticulitis.  Patient with exquisite left lower quadrant abdominal tenderness with generalized tenderness throughout. Will obtain labs, pain control and obtain CT scan to rule out perforation. VSS< afebrile.    7:10PM CT with evidence of uncomplicated diverticulitis.  Pt pain is controlled and he is well appearing.  No emesis in the ED.   8:33 PM Pt given cipro and flagyl here in the ED.  He continues to rest comfortably.   He has tolerated PO without emesis or increase in pain.  He is afebrile, without tachycardia or hypotension.    Patient is nontoxic, nonseptic appearing, in no apparent distress.  Patient's pain and other symptoms adequately managed in emergency department.  Fluid bolus given.  Labs, imaging and vitals reviewed.  Patient does not meet the SIRS or Sepsis criteria.  On repeat exam patient does not have a surgical abdomin and there are no peritoneal signs.  Pt d/c home with pain medication, antibiotics and antiemetic.   I have also discussed reasons to return immediately to the ER.  Patient expresses understanding and agrees with plan.  BP 123/83 mmHg  Pulse 106  Temp(Src) 98.5 F (36.9 C) (Oral)  Resp 18  Ht 5\' 8"  (1.727 m)  Wt 235 lb (106.595 kg)  BMI 35.74 kg/m2  SpO2 96%       Dierdre Forth, PA-C 04/26/15 2044  Pricilla Loveless, MD 05/01/15 1441

## 2015-04-26 NOTE — ED Notes (Signed)
Patient transported to CT 

## 2015-04-26 NOTE — ED Notes (Signed)
Per EMS: Pt from work.  C/o LLQ x 45 mins.  States that he has hx of diverticulitis and thinks it is "acting up".  Pt states that he had a bowel movement (normal) and pain intensified after that.

## 2016-03-10 IMAGING — CT CT ABD-PELV W/ CM
2 of 5 series · 17 of 46 positions shown, 19 images · IV contrast (OMNIPAQUE 300)
Comparison: CT dated 06/24/2010

CLINICAL DATA: 49-year-old male with left lower quadrant abdominal
pain and history of diverticulitis.

EXAM:
CT ABDOMEN AND PELVIS WITH CONTRAST
TECHNIQUE: Multidetector CT imaging of the abdomen and pelvis was performed
using the standard protocol following bolus administration of
intravenous contrast.
CONTRAST:  100mL OMNIPAQUE IOHEXOL 300 MG/ML  SOLN

[Series 2: abd/pel with · axial · 0.89mm/px · z∈[+784,+1219]mm · 14 of 99 slices shown, 16 images]
[im 6/99  soft-tissue]
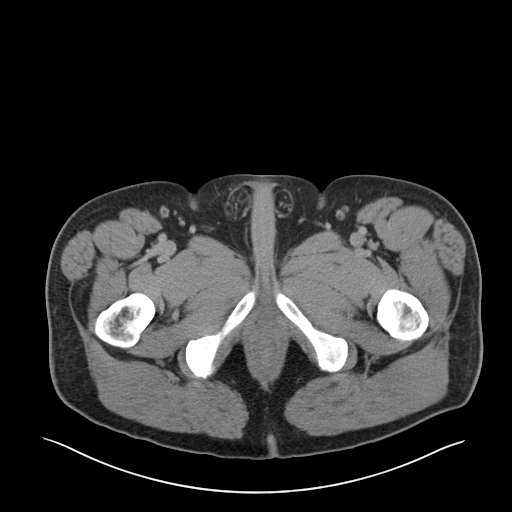
[im 6/99  bone]
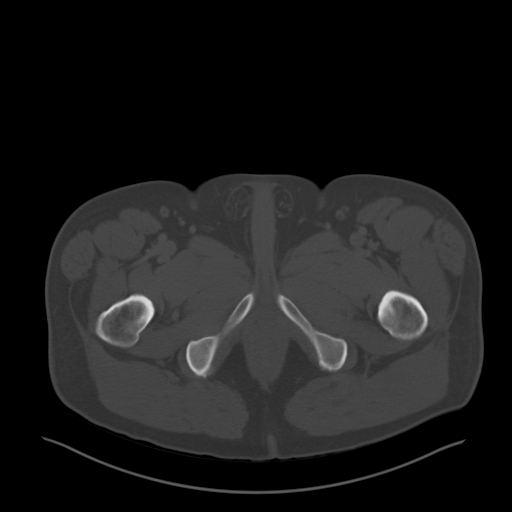
[im 11/99  soft-tissue]
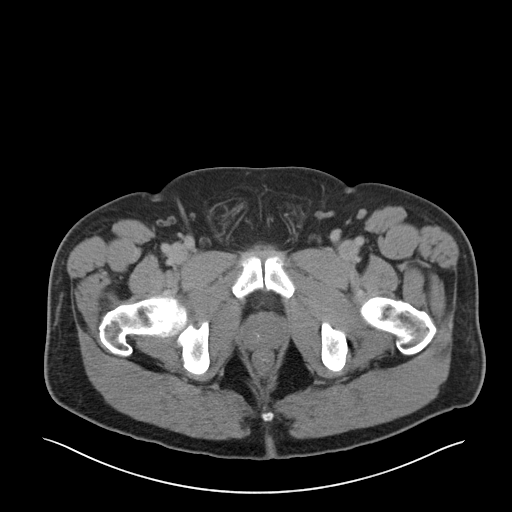
[im 22/99  soft-tissue]
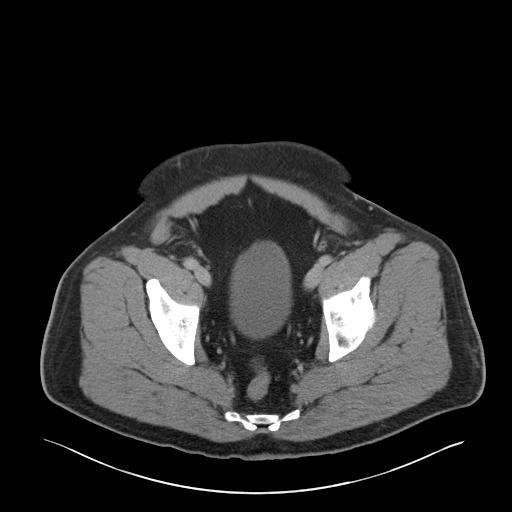
[im 28/99  soft-tissue]
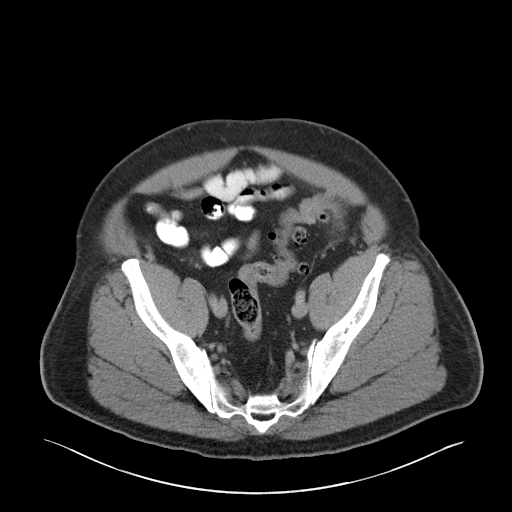
[im 33/99  soft-tissue]
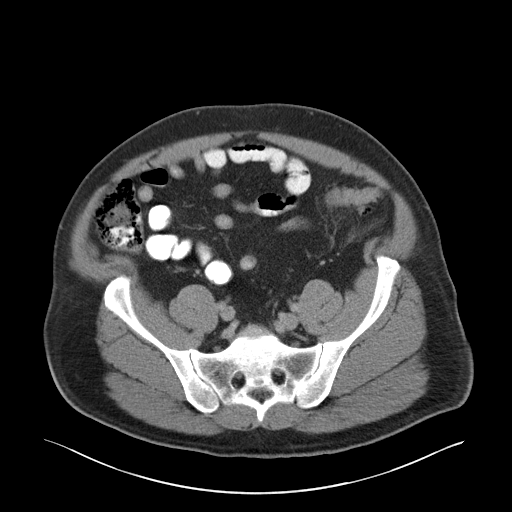
[im 39/99  soft-tissue]
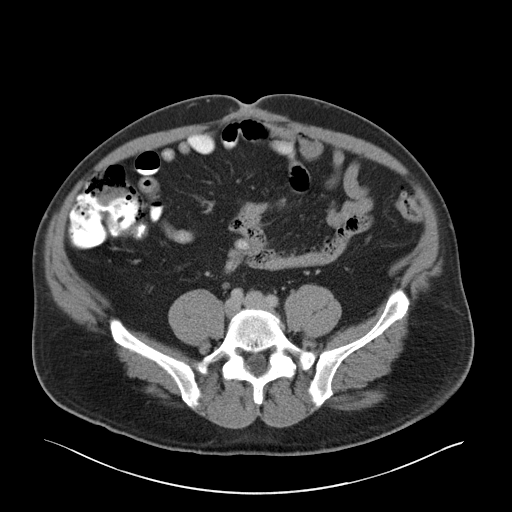
[im 44/99  soft-tissue]
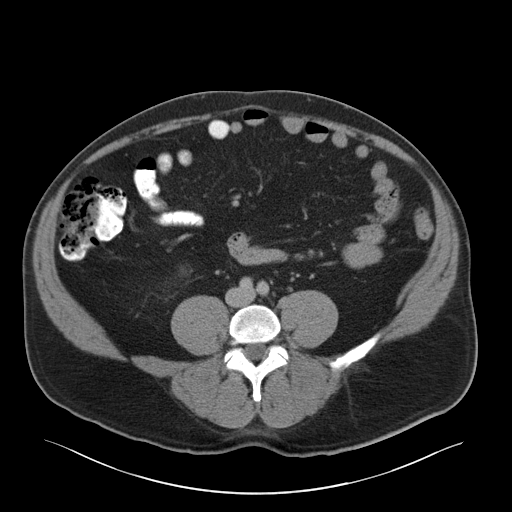
[im 55/99  soft-tissue]
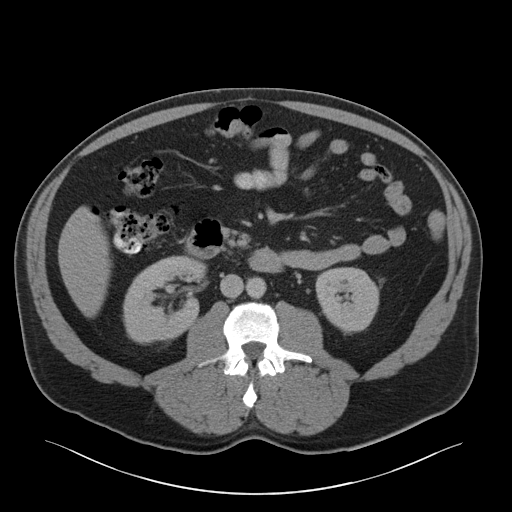
[im 60/99  soft-tissue]
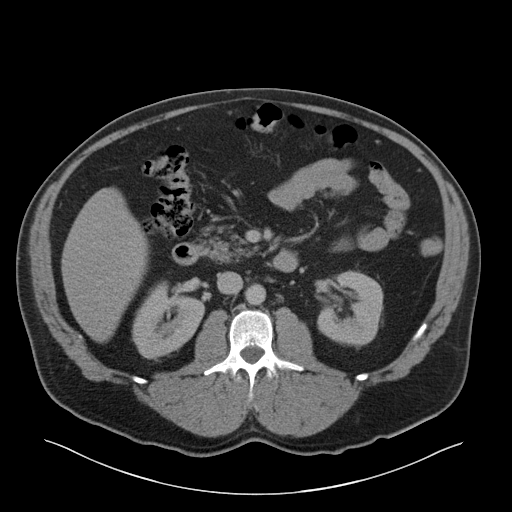
[im 60/99  bone]
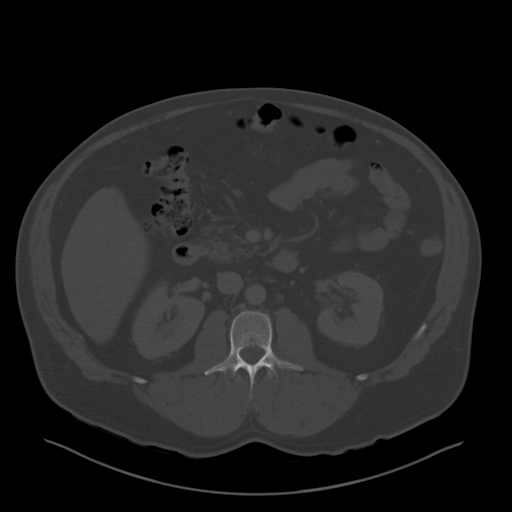
[im 66/99  soft-tissue]
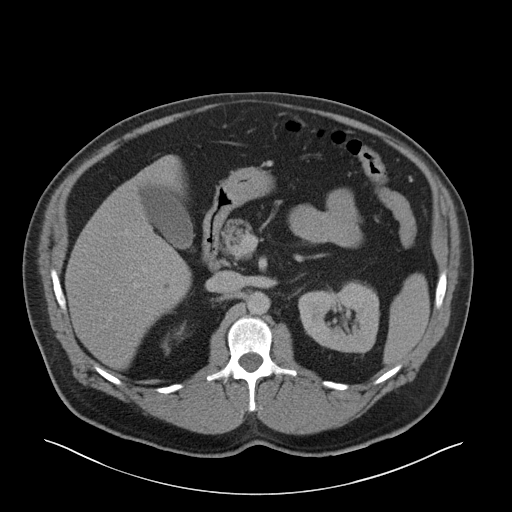
[im 71/99  soft-tissue]
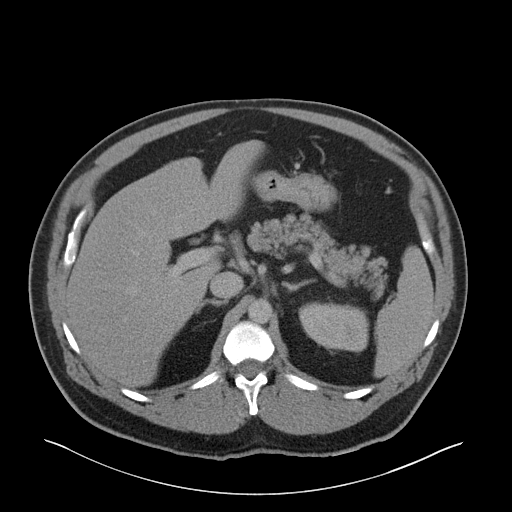
[im 77/99  soft-tissue]
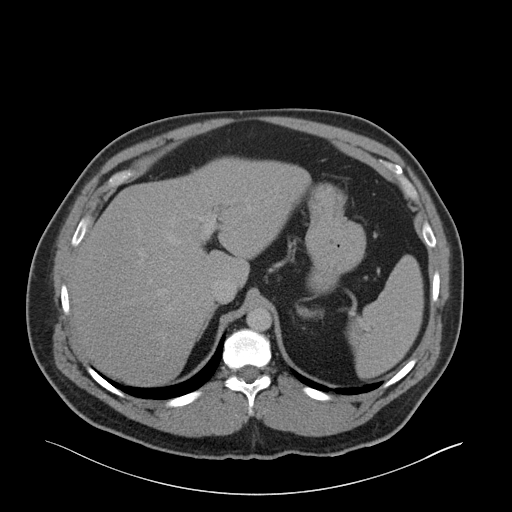
[im 88/99  soft-tissue]
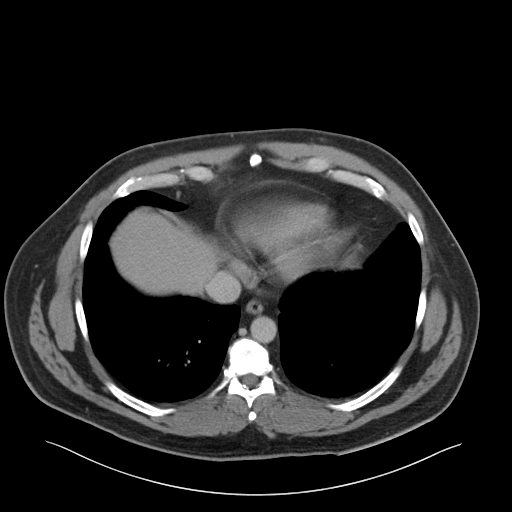
[im 93/99  soft-tissue]
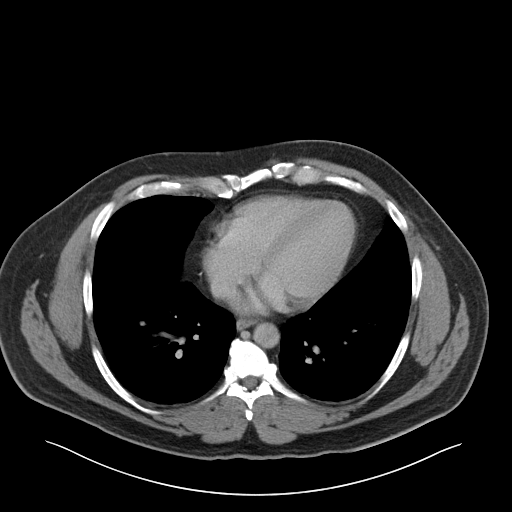

[Series 3: coronal a/|p · coronal · 0.80mm/px · 3 of 112 slices shown]
[im 38/112  soft-tissue]
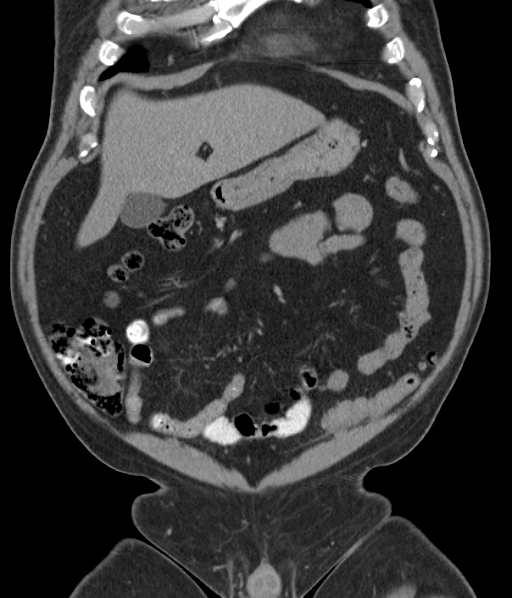
[im 50/112  soft-tissue]
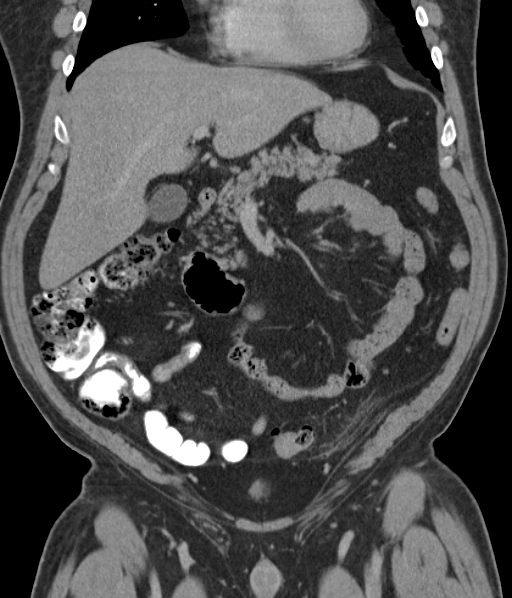
[im 62/112  soft-tissue]
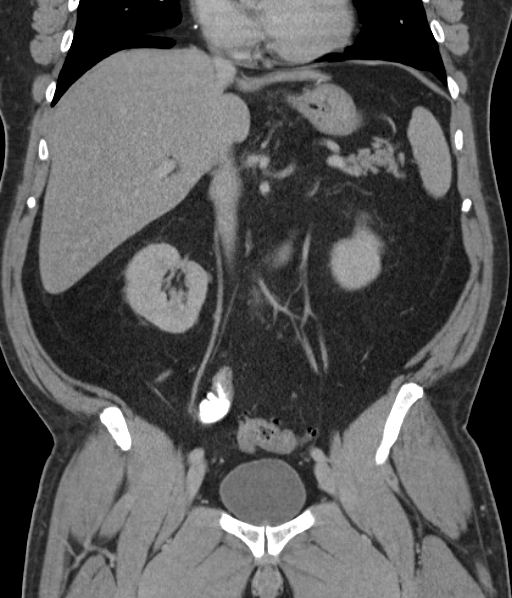

[17 of 46 positions shown; findings below may reference images not displayed]

FINDINGS: Lower chest:  Unremarkable.

Peritoneum: No free air or free fluid.

Liver: Unremarkable. Subcentimeter right hepatic hypodense focus is
too small to characterize.No intrahepatic biliary ductal dilatation.

Gallbladder: Unremarkable.

Pancreas: Unremarkable.No ductal dilatation.

Spleen: Unremarkable.

Adrenals glands: Unremarkable.

Kidneys, ureters, urinary bladder: Unremarkable.

Reproductive: Unremarkable.

Bowel and appendix: Scattered sigmoid and colonic diverticula. There
is inflammatory changes with perisigmoid stranding compatible with
diverticulitis. No abscess. No bowel obstruction.The appendix is
unremarkable.

Vascular/Lymphatic: Unremarkable.No lymphadenopathy.

Musculoskeletal/Abdominal wall: Small bilateral fat containing
inguinal hernias. Small benign-appearing move stable T10 vertebra
lucent lesion.
IMPRESSION: Sigmoid diverticulitis.  No abscess.
